# Patient Record
Sex: Female | Born: 1965 | Race: Black or African American | Hispanic: No | Smoking: Never smoker
Health system: Southern US, Community
[De-identification: ages and names within clinical notes are randomized; demographics above are authoritative.]

## PROBLEM LIST (undated history)

## (undated) DIAGNOSIS — Z803 Family history of malignant neoplasm of breast: Secondary | ICD-10-CM

## (undated) DIAGNOSIS — E559 Vitamin D deficiency, unspecified: Secondary | ICD-10-CM

## (undated) DIAGNOSIS — M0520 Rheumatoid vasculitis with rheumatoid arthritis of unspecified site: Secondary | ICD-10-CM

## (undated) DIAGNOSIS — Z1379 Encounter for other screening for genetic and chromosomal anomalies: Secondary | ICD-10-CM

## (undated) DIAGNOSIS — E669 Obesity, unspecified: Secondary | ICD-10-CM

## (undated) DIAGNOSIS — Z0389 Encounter for observation for other suspected diseases and conditions ruled out: Secondary | ICD-10-CM

## (undated) DIAGNOSIS — D649 Anemia, unspecified: Secondary | ICD-10-CM

## (undated) DIAGNOSIS — G43009 Migraine without aura, not intractable, without status migrainosus: Secondary | ICD-10-CM

## (undated) DIAGNOSIS — T7840XA Allergy, unspecified, initial encounter: Secondary | ICD-10-CM

## (undated) DIAGNOSIS — IMO0001 Reserved for inherently not codable concepts without codable children: Secondary | ICD-10-CM

## (undated) HISTORY — DX: Obesity, unspecified: E66.9

## (undated) HISTORY — DX: Reserved for inherently not codable concepts without codable children: IMO0001

## (undated) HISTORY — DX: Allergy, unspecified, initial encounter: T78.40XA

## (undated) HISTORY — DX: Encounter for observation for other suspected diseases and conditions ruled out: Z03.89

## (undated) HISTORY — DX: Encounter for other screening for genetic and chromosomal anomalies: Z13.79

## (undated) HISTORY — DX: Family history of malignant neoplasm of breast: Z80.3

## (undated) HISTORY — DX: Migraine without aura, not intractable, without status migrainosus: G43.009

## (undated) HISTORY — DX: Anemia, unspecified: D64.9

## (undated) HISTORY — DX: Vitamin D deficiency, unspecified: E55.9

## (undated) HISTORY — DX: Rheumatoid vasculitis with rheumatoid arthritis of unspecified site: M05.20

---

## 1992-09-27 HISTORY — PX: HEMORRHOID SURGERY: SHX153

## 1998-02-13 ENCOUNTER — Encounter: Admission: RE | Admit: 1998-02-13 | Discharge: 1998-02-13 | Payer: Self-pay | Admitting: Internal Medicine

## 1998-02-24 ENCOUNTER — Ambulatory Visit (HOSPITAL_COMMUNITY): Admission: RE | Admit: 1998-02-24 | Discharge: 1998-02-24 | Payer: Self-pay | Admitting: Obstetrics

## 1998-02-25 ENCOUNTER — Encounter: Admission: RE | Admit: 1998-02-25 | Discharge: 1998-02-25 | Payer: Self-pay | Admitting: Obstetrics & Gynecology

## 1998-03-10 ENCOUNTER — Ambulatory Visit (HOSPITAL_COMMUNITY): Admission: RE | Admit: 1998-03-10 | Discharge: 1998-03-10 | Payer: Self-pay | Admitting: Obstetrics & Gynecology

## 2001-09-24 ENCOUNTER — Emergency Department (HOSPITAL_COMMUNITY): Admission: EM | Admit: 2001-09-24 | Discharge: 2001-09-24 | Payer: Self-pay | Admitting: Emergency Medicine

## 2002-01-18 ENCOUNTER — Encounter: Payer: Self-pay | Admitting: Nephrology

## 2002-01-18 ENCOUNTER — Encounter: Admission: RE | Admit: 2002-01-18 | Discharge: 2002-01-18 | Payer: Self-pay | Admitting: Nephrology

## 2003-07-12 ENCOUNTER — Ambulatory Visit (HOSPITAL_COMMUNITY): Admission: RE | Admit: 2003-07-12 | Discharge: 2003-07-12 | Payer: Self-pay | Admitting: *Deleted

## 2003-07-12 ENCOUNTER — Encounter: Payer: Self-pay | Admitting: *Deleted

## 2003-07-26 ENCOUNTER — Ambulatory Visit (HOSPITAL_COMMUNITY): Admission: RE | Admit: 2003-07-26 | Discharge: 2003-07-26 | Payer: Self-pay | Admitting: *Deleted

## 2003-09-30 ENCOUNTER — Ambulatory Visit (HOSPITAL_COMMUNITY): Admission: RE | Admit: 2003-09-30 | Discharge: 2003-09-30 | Payer: Self-pay | Admitting: *Deleted

## 2003-12-10 ENCOUNTER — Encounter: Admission: RE | Admit: 2003-12-10 | Discharge: 2003-12-10 | Payer: Self-pay | Admitting: *Deleted

## 2003-12-12 ENCOUNTER — Inpatient Hospital Stay (HOSPITAL_COMMUNITY): Admission: AD | Admit: 2003-12-12 | Discharge: 2003-12-14 | Payer: Self-pay | Admitting: *Deleted

## 2004-07-09 ENCOUNTER — Ambulatory Visit (HOSPITAL_COMMUNITY): Admission: RE | Admit: 2004-07-09 | Discharge: 2004-07-09 | Payer: Self-pay | Admitting: *Deleted

## 2004-08-28 ENCOUNTER — Ambulatory Visit (HOSPITAL_COMMUNITY): Admission: RE | Admit: 2004-08-28 | Discharge: 2004-08-28 | Payer: Self-pay | Admitting: *Deleted

## 2004-11-13 ENCOUNTER — Ambulatory Visit (HOSPITAL_COMMUNITY): Admission: RE | Admit: 2004-11-13 | Discharge: 2004-11-13 | Payer: Self-pay | Admitting: *Deleted

## 2004-12-14 ENCOUNTER — Ambulatory Visit (HOSPITAL_COMMUNITY): Admission: RE | Admit: 2004-12-14 | Discharge: 2004-12-14 | Payer: Self-pay | Admitting: *Deleted

## 2005-01-04 ENCOUNTER — Inpatient Hospital Stay (HOSPITAL_COMMUNITY): Admission: AD | Admit: 2005-01-04 | Discharge: 2005-01-04 | Payer: Self-pay | Admitting: *Deleted

## 2005-01-07 ENCOUNTER — Inpatient Hospital Stay (HOSPITAL_COMMUNITY): Admission: RE | Admit: 2005-01-07 | Discharge: 2005-01-07 | Payer: Self-pay | Admitting: *Deleted

## 2005-01-07 ENCOUNTER — Ambulatory Visit: Payer: Self-pay | Admitting: Obstetrics & Gynecology

## 2005-01-11 ENCOUNTER — Ambulatory Visit: Payer: Self-pay | Admitting: Obstetrics and Gynecology

## 2005-01-11 ENCOUNTER — Inpatient Hospital Stay (HOSPITAL_COMMUNITY): Admission: AD | Admit: 2005-01-11 | Discharge: 2005-01-14 | Payer: Self-pay | Admitting: *Deleted

## 2006-09-28 ENCOUNTER — Encounter: Admission: RE | Admit: 2006-09-28 | Discharge: 2006-09-28 | Payer: Self-pay | Admitting: Obstetrics & Gynecology

## 2006-10-19 ENCOUNTER — Other Ambulatory Visit: Admission: RE | Admit: 2006-10-19 | Discharge: 2006-10-19 | Payer: Self-pay | Admitting: Obstetrics & Gynecology

## 2007-02-09 ENCOUNTER — Encounter: Admission: RE | Admit: 2007-02-09 | Discharge: 2007-02-09 | Payer: Self-pay | Admitting: Obstetrics & Gynecology

## 2008-12-16 ENCOUNTER — Encounter: Admission: RE | Admit: 2008-12-16 | Discharge: 2008-12-16 | Payer: Self-pay | Admitting: Internal Medicine

## 2009-05-06 ENCOUNTER — Encounter: Admission: RE | Admit: 2009-05-06 | Discharge: 2009-05-06 | Payer: Self-pay | Admitting: Obstetrics & Gynecology

## 2010-05-07 ENCOUNTER — Encounter: Admission: RE | Admit: 2010-05-07 | Discharge: 2010-05-07 | Payer: Self-pay | Admitting: Obstetrics & Gynecology

## 2010-05-13 ENCOUNTER — Encounter: Admission: RE | Admit: 2010-05-13 | Discharge: 2010-05-13 | Payer: Self-pay | Admitting: Obstetrics & Gynecology

## 2010-10-18 ENCOUNTER — Encounter: Payer: Self-pay | Admitting: Obstetrics & Gynecology

## 2011-08-31 ENCOUNTER — Other Ambulatory Visit: Payer: Self-pay | Admitting: Obstetrics & Gynecology

## 2011-08-31 DIAGNOSIS — Z1231 Encounter for screening mammogram for malignant neoplasm of breast: Secondary | ICD-10-CM

## 2011-09-27 ENCOUNTER — Ambulatory Visit
Admission: RE | Admit: 2011-09-27 | Discharge: 2011-09-27 | Disposition: A | Discharge status: Home / Self Care | Payer: BC Managed Care – PPO | Source: Ambulatory Visit | Attending: Obstetrics & Gynecology | Admitting: Obstetrics & Gynecology

## 2011-09-27 DIAGNOSIS — Z1231 Encounter for screening mammogram for malignant neoplasm of breast: Secondary | ICD-10-CM

## 2011-12-27 ENCOUNTER — Ambulatory Visit (INDEPENDENT_AMBULATORY_CARE_PROVIDER_SITE_OTHER): Payer: BC Managed Care – PPO | Admitting: Internal Medicine

## 2011-12-27 VITALS — BP 120/79 | HR 84 | Temp 98.1°F | Resp 16 | Ht 61.5 in | Wt 177.0 lb

## 2011-12-27 DIAGNOSIS — J019 Acute sinusitis, unspecified: Secondary | ICD-10-CM

## 2011-12-27 DIAGNOSIS — J301 Allergic rhinitis due to pollen: Secondary | ICD-10-CM

## 2011-12-27 DIAGNOSIS — J029 Acute pharyngitis, unspecified: Secondary | ICD-10-CM

## 2011-12-27 DIAGNOSIS — J329 Chronic sinusitis, unspecified: Secondary | ICD-10-CM

## 2011-12-27 MED ORDER — FLUTICASONE PROPIONATE 50 MCG/ACT NA SUSP: 2.0000 | Freq: Every day | NASAL | Status: DC

## 2011-12-27 MED ORDER — AMOXICILLIN 500 MG PO CAPS: 1000.0000 mg | ORAL_CAPSULE | Freq: Two times a day (BID) | ORAL | Status: AC

## 2011-12-27 MED ORDER — PREDNISONE 20 MG PO TABS: ORAL_TABLET | ORAL | Status: DC

## 2011-12-27 NOTE — Progress Notes (Signed)
  Subjective:    Patient ID: Jade Farrell, female    DOB: 1966-07-25, 46 y.o.   MRN: [id number]  HPI    Review of Systems     Objective:   Physical Exam        Results for orders placed in visit on 12/27/11  POCT RAPID STREP A (OFFICE)      Component Value Range   Rapid Strep A Screen Negative  Negative     Assessment & Plan:  Problem #1 recurrent sinusitis Problem #2 allergic rhinitis Problem #3 cough secondary

## 2012-09-22 ENCOUNTER — Other Ambulatory Visit: Payer: Self-pay | Admitting: Obstetrics & Gynecology

## 2012-09-22 DIAGNOSIS — Z1231 Encounter for screening mammogram for malignant neoplasm of breast: Secondary | ICD-10-CM

## 2012-09-28 ENCOUNTER — Other Ambulatory Visit: Payer: Self-pay | Admitting: Family Medicine

## 2012-09-28 DIAGNOSIS — R109 Unspecified abdominal pain: Secondary | ICD-10-CM

## 2012-09-29 ENCOUNTER — Ambulatory Visit
Admission: RE | Admit: 2012-09-29 | Discharge: 2012-09-29 | Disposition: A | Discharge status: Home / Self Care | Payer: BC Managed Care – PPO | Source: Ambulatory Visit | Attending: Family Medicine | Admitting: Family Medicine

## 2012-09-29 DIAGNOSIS — R109 Unspecified abdominal pain: Secondary | ICD-10-CM

## 2012-09-29 MED ORDER — IOHEXOL 300 MG/ML  SOLN
100.0000 mL | Freq: Once | INTRAMUSCULAR | Status: AC | PRN
  Administered 2012-09-29: 100 mL via INTRAVENOUS

## 2012-10-23 ENCOUNTER — Ambulatory Visit: Payer: BC Managed Care – PPO

## 2012-10-24 ENCOUNTER — Ambulatory Visit
Admission: RE | Admit: 2012-10-24 | Discharge: 2012-10-24 | Disposition: A | Discharge status: Home / Self Care | Payer: BC Managed Care – PPO | Source: Ambulatory Visit | Attending: Obstetrics & Gynecology | Admitting: Obstetrics & Gynecology

## 2012-10-24 DIAGNOSIS — Z1231 Encounter for screening mammogram for malignant neoplasm of breast: Secondary | ICD-10-CM

## 2012-12-21 ENCOUNTER — Other Ambulatory Visit: Payer: Self-pay | Admitting: Family Medicine

## 2012-12-21 DIAGNOSIS — R221 Localized swelling, mass and lump, neck: Secondary | ICD-10-CM

## 2013-01-08 ENCOUNTER — Ambulatory Visit
Admission: RE | Admit: 2013-01-08 | Discharge: 2013-01-08 | Disposition: A | Discharge status: Home / Self Care | Payer: BC Managed Care – PPO | Source: Ambulatory Visit | Attending: Family Medicine | Admitting: Family Medicine

## 2013-01-08 DIAGNOSIS — R221 Localized swelling, mass and lump, neck: Secondary | ICD-10-CM

## 2013-01-09 ENCOUNTER — Other Ambulatory Visit: Payer: Self-pay | Admitting: Family Medicine

## 2013-01-09 DIAGNOSIS — E041 Nontoxic single thyroid nodule: Secondary | ICD-10-CM

## 2013-01-12 ENCOUNTER — Ambulatory Visit (HOSPITAL_COMMUNITY)
Admission: RE | Admit: 2013-01-12 | Discharge: 2013-01-12 | Disposition: A | Discharge status: Home / Self Care | Payer: BC Managed Care – PPO | Source: Ambulatory Visit | Attending: Family Medicine | Admitting: Family Medicine

## 2013-01-12 DIAGNOSIS — E041 Nontoxic single thyroid nodule: Secondary | ICD-10-CM

## 2013-04-02 ENCOUNTER — Telehealth: Payer: Self-pay | Admitting: Gynecology

## 2013-04-02 DIAGNOSIS — N83209 Unspecified ovarian cyst, unspecified side: Secondary | ICD-10-CM

## 2013-04-02 NOTE — Telephone Encounter (Signed)
Left message on CB#VM of need to call office concerning her question of appointment,

## 2013-04-02 NOTE — Telephone Encounter (Signed)
Routed to triage 

## 2013-04-02 NOTE — Telephone Encounter (Signed)
Would like to know if she needs an appointment for follow up for her ultra sound.

## 2013-04-03 NOTE — Telephone Encounter (Signed)
Left message at call back # of need to return call for her question of ultra sound.

## 2013-04-04 ENCOUNTER — Telehealth: Payer: Self-pay | Admitting: Gynecology

## 2013-04-04 NOTE — Telephone Encounter (Signed)
Patient calling about scheduling follow up ultrasound. Jan 22014 PUS (see paper chart pre-EPIC) right ovarian cyst. Appt scheduled for 04-10-13 at 330.

## 2013-04-04 NOTE — Telephone Encounter (Signed)
LVM advising office copay due for pus.

## 2013-04-09 ENCOUNTER — Ambulatory Visit (INDEPENDENT_AMBULATORY_CARE_PROVIDER_SITE_OTHER): Payer: BC Managed Care – PPO | Admitting: Gynecology

## 2013-04-09 ENCOUNTER — Other Ambulatory Visit: Payer: Self-pay | Admitting: Gynecology

## 2013-04-09 ENCOUNTER — Ambulatory Visit (INDEPENDENT_AMBULATORY_CARE_PROVIDER_SITE_OTHER): Payer: BC Managed Care – PPO

## 2013-04-09 DIAGNOSIS — N83209 Unspecified ovarian cyst, unspecified side: Secondary | ICD-10-CM

## 2013-04-09 DIAGNOSIS — N949 Unspecified condition associated with female genital organs and menstrual cycle: Secondary | ICD-10-CM

## 2013-04-09 DIAGNOSIS — N839 Noninflammatory disorder of ovary, fallopian tube and broad ligament, unspecified: Secondary | ICD-10-CM

## 2013-04-09 DIAGNOSIS — N83201 Unspecified ovarian cyst, right side: Secondary | ICD-10-CM

## 2013-04-09 DIAGNOSIS — M545 Low back pain, unspecified: Secondary | ICD-10-CM

## 2013-04-09 DIAGNOSIS — Z975 Presence of (intrauterine) contraceptive device: Secondary | ICD-10-CM | POA: Insufficient documentation

## 2013-04-09 MED ORDER — NORETHIN-ETH ESTRAD-FE BIPHAS 1 MG-10 MCG / 10 MCG PO TABS: 1.0000 | ORAL_TABLET | Freq: Every day | ORAL | Status: DC

## 2013-04-09 NOTE — Patient Instructions (Addendum)
Recommend evaluation with chiropractor or orthopedics regarding low back pain

## 2013-04-09 NOTE — Progress Notes (Signed)
          Pt is here to follow up u/s for ovarian cyst on left, pt is contracepting with an IUD-Mirena and has no cycle, she reports having left sided pain.  The u/s from today and previous were compared, the leftt sided cyst that was noted on the prior scan has resolved however she now has a similar cyst on the right, it appears to have 2 follicles, both simple, echofree and neg color flow doppler. We discussed that these appear to be benign ovarian cysts that might be related to ovulation.  We suggested a trial of oral contraceptives to suppress her ovaries to see if indeed the cyst is related to her pelvic pain, we discussed starting oc as she does not have a menses and that she might have some break-thru bleeding initially, but by the second pack we will be better able to assess.   Pt has no contraindications to oc use, she has rare migraines but no aura.  She reports she has the IUD for heavy menses and is assured that proper oc use will treat that as well.  If she does well on the ocp, we will consider removal of the IUD, she is agreeable.@ samples of LoLoestrin were given and rx was sent in We would like to see her back in 6m. We also suggest she be evaluated by either ortho or chiropractor for her low back pain if it persists.  Length of time 72m, >50% face to face discussing pelvic pain, ovarian cysts and menorrhagia

## 2013-07-04 ENCOUNTER — Encounter: Payer: Self-pay | Admitting: Gynecology

## 2013-07-05 ENCOUNTER — Telehealth: Payer: Self-pay | Admitting: Gynecology

## 2013-07-05 NOTE — Telephone Encounter (Signed)
LMTCB to reschedule PUS.  °

## 2013-07-10 ENCOUNTER — Other Ambulatory Visit: Payer: BC Managed Care – PPO | Admitting: Gynecology

## 2013-07-10 ENCOUNTER — Other Ambulatory Visit: Payer: BC Managed Care – PPO

## 2013-07-13 ENCOUNTER — Telehealth: Payer: Self-pay | Admitting: Gynecology

## 2013-07-13 NOTE — Telephone Encounter (Signed)
LMTCB to reschedule PUS.

## 2013-08-07 ENCOUNTER — Ambulatory Visit (INDEPENDENT_AMBULATORY_CARE_PROVIDER_SITE_OTHER): Payer: BC Managed Care – PPO

## 2013-08-07 ENCOUNTER — Ambulatory Visit (INDEPENDENT_AMBULATORY_CARE_PROVIDER_SITE_OTHER): Payer: BC Managed Care – PPO | Admitting: Gynecology

## 2013-08-07 VITALS — BP 118/80 | Resp 12 | Ht 61.5 in | Wt 181.0 lb

## 2013-08-07 DIAGNOSIS — N83209 Unspecified ovarian cyst, unspecified side: Secondary | ICD-10-CM

## 2013-08-07 DIAGNOSIS — N83299 Other ovarian cyst, unspecified side: Secondary | ICD-10-CM | POA: Insufficient documentation

## 2013-08-07 DIAGNOSIS — M545 Low back pain, unspecified: Secondary | ICD-10-CM | POA: Insufficient documentation

## 2013-08-07 NOTE — Progress Notes (Signed)
      Pt here to evaluate recurrent ovarian cysts.  Pt had had a right sided cyst as an incidental finding for evaluation of lower back pain in Jan 2014, f/u u/'s have shown a left and then a right cyst and now today a left cyst.  The images were all reviewed with the pt, they appear to be different cysts, she does not have a cycle due to the Mirena IUD.  Pt is without symptoms, we had started her on ocp to suppress the ovaries but she was forgetful with them and only used for 6w.  We reviewed the MOA of ocp with recurrent cyst, she feels that since she is asymptomatic that she would prefer to do nothing at this time. We reviewed the signs and symptoms of ovarian cysts and cyst rupture and suggest if she develops symptoms she should be seen and she is agreeable. We will see her for her annual in Feb 2015 and reassess how she is doing.

## 2013-10-02 ENCOUNTER — Other Ambulatory Visit: Payer: Self-pay

## 2013-10-02 DIAGNOSIS — Z1231 Encounter for screening mammogram for malignant neoplasm of breast: Secondary | ICD-10-CM

## 2013-10-18 ENCOUNTER — Ambulatory Visit: Payer: BC Managed Care – PPO | Admitting: Gynecology

## 2013-10-19 ENCOUNTER — Ambulatory Visit: Payer: BC Managed Care – PPO | Admitting: Gynecology

## 2013-10-24 ENCOUNTER — Encounter: Payer: Self-pay | Admitting: Gynecology

## 2013-10-24 DIAGNOSIS — G43009 Migraine without aura, not intractable, without status migrainosus: Secondary | ICD-10-CM | POA: Insufficient documentation

## 2013-10-26 ENCOUNTER — Ambulatory Visit: Payer: BC Managed Care – PPO

## 2013-10-29 ENCOUNTER — Ambulatory Visit: Payer: BC Managed Care – PPO | Admitting: Gynecology

## 2013-10-29 ENCOUNTER — Telehealth: Payer: Self-pay | Admitting: Gynecology

## 2013-10-29 NOTE — Telephone Encounter (Signed)
Patient called and cancelled her appointment for AEX today with Dr. Farrel GobbleLathrop due to she thinks she may have the flu. She will call back to reschedule and is in recall.

## 2013-11-15 ENCOUNTER — Ambulatory Visit (INDEPENDENT_AMBULATORY_CARE_PROVIDER_SITE_OTHER): Payer: BC Managed Care – PPO | Admitting: Cardiology

## 2013-11-15 ENCOUNTER — Encounter: Payer: Self-pay | Admitting: Cardiology

## 2013-11-15 VITALS — BP 120/86 | HR 86 | Ht 61.0 in | Wt 182.0 lb

## 2013-11-15 DIAGNOSIS — R079 Chest pain, unspecified: Secondary | ICD-10-CM

## 2013-11-15 NOTE — Progress Notes (Signed)
Patient ID: Jade Farrell, female   DOB: [birth year], 48 y.o.   MRN: [id number]     Patient Name: Jade Farrell Date of Encounter: 11/15/2013  Primary Care Provider:  No PCP Per Patient Primary Cardiologist:  Tobias Alexander, H  Problem List   Past Medical History  Diagnosis Date  . Anemia   . Migraine without aura   . Allergy   . Arthritis    Past Surgical History  Procedure Laterality Date  . Hemorrhoid surgery  1994    Removed    Allergies  No Known Allergies  HPI  A 48 year old female with no significant PMH who is coming with complains of chest pain. The pain at the right upper chest radiating to the right arm. They can happen at rest and on exertion, sharp and lasting few minutes. She has positive FH of CD In her father (MI in hs 43) and paternal uncle who had PCI/stenting. 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 She otherwise denies SOB, Palpitations, orthopnea or PND. The patient has 5 children.   Home Medications  Prior to Admission medications   Medication Sig Start Date End Date Taking? Authorizing Provider  Ibuprofen (ADVIL PO) Take by mouth as needed.   Yes Historical Provider, MD  loratadine (CLARITIN) 10 MG tablet Take 10 mg by mouth daily.   Yes Historical Provider, MD  meloxicam (MOBIC) 15 MG tablet Take 15 mg by mouth daily.   Yes Historical Provider, MD  Multiple Vitamins-Minerals (MULTIVITAMIN PO) Take by mouth.   Yes Historical Provider, MD  Naproxen Sodium (ALEVE PO) Take by mouth as needed.   Yes Historical Provider, MD  Vitamin D, Ergocalciferol, (DRISDOL) 50000 UNITS CAPS Take 50,000 Units by mouth.   Yes Historical Provider, MD  amoxicillin-clavulanate (AUGMENTIN) 875-125 MG per tablet Take 1 tablet by mouth 2 (two) times daily.    Historical Provider, MD  cyclobenzaprine (FLEXERIL) 10 MG tablet Take 10 mg by mouth 3 (three) times daily as needed for muscle spasms.    Historical Provider, MD  fluticasone (FLONASE) 50 MCG/ACT nasal  spray Place 2 sprays into the nose daily. 12/27/11 12/26/12  Tonye Pearson, MD  mometasone (NASONEX) 50 MCG/ACT nasal spray Place 2 sprays into the nose daily.    Historical Provider, MD  Norethindrone-Ethinyl Estradiol-Fe Biphas (LO LOESTRIN FE) 1 MG-10 MCG / 10 MCG tablet Take 1 tablet by mouth daily. 04/09/13   Bennye Alm, MD  Norethindrone-Ethinyl Estradiol-Fe Biphas (LO LOESTRIN FE) 1 MG-10 MCG / 10 MCG tablet Take 1 tablet by mouth daily. 04/09/13   Bennye Alm, MD  predniSONE (DELTASONE) 20 MG tablet 3/3/2/2/1/1Single daily dose for 6 days 12/27/11   Tonye Pearson, MD    Family History  Family History  Problem Relation Age of Onset  . Diabetes Mother   . Hypertension Father   . Heart disease Father   . Breast cancer Sister 47  . Hypertension Sister   . Thyroid disease Sister   . Hypertension Brother   . Heart disease Paternal Uncle     Social History  History   Social History  . Marital Status: Married    Spouse Name: N/A    Number of Children: N/A  . Years of Education: N/A   Occupational History  . Not on file.   Social History Main Topics  . Smoking status: Never Smoker   . Smokeless tobacco: Not on file  . Alcohol Use: Not  on file  . Drug Use: Not on file  . Sexual Activity: Not on file   Other Topics Concern  . Not on file   Social History Narrative  . No narrative on file     Review of Systems, as per HPI, otherwise negative General:  No chills, fever, night sweats or weight changes.  Cardiovascular:  No chest pain, dyspnea on exertion, edema, orthopnea, palpitations, paroxysmal nocturnal dyspnea. Dermatological: No rash, lesions/masses Respiratory: No cough, dyspnea Urologic: No hematuria, dysuria Abdominal:   No nausea, vomiting, diarrhea, bright red blood per rectum, melena, or hematemesis Neurologic:  No visual changes, wkns, changes in mental status. All other systems reviewed and are otherwise negative except as noted  above.  Physical Exam  Blood pressure 120/86, pulse 86, height 5\' 1"  (1.549 m), weight 182 lb (82.555 kg).  General: Pleasant, NAD Psych: Normal affect. Neuro: Alert and oriented X 3. Moves all extremities spontaneously. HEENT: Normal  Neck: Supple without bruits or JVD. Lungs:  Resp regular and unlabored, CTA. Heart: RRR no s3, s4, or murmurs. Abdomen: Soft, non-tender, non-distended, BS + x 4.  Extremities: No clubbing, cyanosis or edema. DP/PT/Radials 2+ and equal bilaterally.  Labs: None  Accessory Clinical Findings  Echocardiogram - none  ECG - normal sinus rhythm with sinus arrhythmia, right atrium enlargement, borderline EKG   Assessment & Plan    A pleasant 48 year old female with atypical chest pain but risk factors that include FH of CAD, obesity. We will order an exercise nuclear stress test and follow.   Follow up in 1 month.  Tobias AlexanderNELSON, Yichen Gilardi, Rexene EdisonH, MD, [facility] HospitalFACC 11/15/2013, 10:21 AM

## 2013-11-15 NOTE — Patient Instructions (Signed)
**Note De-Identified Jade Farrell Obfuscation** Your physician has requested that you have en exercise stress myoview. For further information please visit [email or web address]. Please follow instruction sheet, as given.  Your physician recommends that you schedule a follow-up appointment in: after stress test

## 2013-11-21 ENCOUNTER — Encounter (HOSPITAL_COMMUNITY): Payer: BC Managed Care – PPO

## 2013-11-29 ENCOUNTER — Other Ambulatory Visit: Payer: Self-pay

## 2013-11-29 DIAGNOSIS — R079 Chest pain, unspecified: Secondary | ICD-10-CM

## 2013-11-30 ENCOUNTER — Ambulatory Visit (INDEPENDENT_AMBULATORY_CARE_PROVIDER_SITE_OTHER): Payer: BC Managed Care – PPO | Admitting: Cardiology

## 2013-11-30 DIAGNOSIS — R079 Chest pain, unspecified: Secondary | ICD-10-CM

## 2013-11-30 NOTE — Progress Notes (Signed)
Patient ID: Jade Farrell, female   DOB: 02-01-1966, 48 y.o.   MRN: [id number]  The appointment had to be rescheduled as the tests were not performed yet.

## 2013-12-19 ENCOUNTER — Ambulatory Visit (HOSPITAL_COMMUNITY): Payer: BC Managed Care – PPO | Attending: Cardiology | Admitting: Radiology

## 2013-12-19 DIAGNOSIS — R079 Chest pain, unspecified: Secondary | ICD-10-CM | POA: Insufficient documentation

## 2013-12-19 DIAGNOSIS — R072 Precordial pain: Secondary | ICD-10-CM

## 2013-12-19 NOTE — Progress Notes (Signed)
Echocardiogram performed.  

## 2013-12-20 ENCOUNTER — Other Ambulatory Visit: Payer: Self-pay

## 2013-12-20 ENCOUNTER — Telehealth (HOSPITAL_COMMUNITY): Payer: Self-pay

## 2013-12-20 ENCOUNTER — Ambulatory Visit (HOSPITAL_COMMUNITY)
Admission: RE | Admit: 2013-12-20 | Discharge: 2013-12-20 | Disposition: A | Discharge status: Home / Self Care | Payer: BC Managed Care – PPO | Source: Ambulatory Visit | Attending: Cardiology | Admitting: Cardiology

## 2013-12-20 DIAGNOSIS — R079 Chest pain, unspecified: Secondary | ICD-10-CM | POA: Insufficient documentation

## 2013-12-20 DIAGNOSIS — R9439 Abnormal result of other cardiovascular function study: Secondary | ICD-10-CM

## 2013-12-21 ENCOUNTER — Telehealth: Payer: Self-pay | Admitting: Cardiology

## 2013-12-21 ENCOUNTER — Other Ambulatory Visit: Payer: BC Managed Care – PPO

## 2013-12-21 ENCOUNTER — Other Ambulatory Visit: Payer: Self-pay

## 2013-12-21 DIAGNOSIS — R079 Chest pain, unspecified: Secondary | ICD-10-CM

## 2013-12-21 NOTE — Telephone Encounter (Signed)
Patient called received message from Dr.Nelson stress test abnormal.Dr.Nelson advised needs cardiac cath as soon as possible.Left cardiac [facility] [place] [facility] [place] [facility]/13 at 11:30 am to pick up instructions and have pre cath lab,cxr.

## 2013-12-21 NOTE — Telephone Encounter (Signed)
New message ° ° ° ° ° °Pt is returning a nurses call °

## 2013-12-21 NOTE — Telephone Encounter (Signed)
Returned call to patient she stated she wanted to reschedule cardiac cath to Tuesday 12/25/13.Cardiac cath [facility]Patient will come to office Monday to have pre cath lab work,cxr and pick up cath instructions.

## 2013-12-21 NOTE — Addendum Note (Signed)
Addended by: Lars MassonNELSON, Emari Hreha H on: 12/21/2013 03:06 PM   Modules accepted: Orders

## 2013-12-24 ENCOUNTER — Ambulatory Visit
Admission: RE | Admit: 2013-12-24 | Discharge: 2013-12-24 | Disposition: A | Discharge status: Home / Self Care | Payer: BC Managed Care – PPO | Source: Ambulatory Visit | Attending: Cardiology | Admitting: Cardiology

## 2013-12-24 ENCOUNTER — Ambulatory Visit (INDEPENDENT_AMBULATORY_CARE_PROVIDER_SITE_OTHER): Payer: BC Managed Care – PPO | Admitting: *Deleted

## 2013-12-24 ENCOUNTER — Telehealth: Payer: Self-pay | Admitting: Cardiology

## 2013-12-24 DIAGNOSIS — R9439 Abnormal result of other cardiovascular function study: Secondary | ICD-10-CM

## 2013-12-24 DIAGNOSIS — R079 Chest pain, unspecified: Secondary | ICD-10-CM

## 2013-12-24 LAB — BASIC METABOLIC PANEL
BUN: 16 mg/dL (ref 6–23)
CO2: 30 mEq/L (ref 19–32)
Calcium: 9.5 mg/dL (ref 8.4–10.5)
Chloride: 99 mEq/L (ref 96–112)
Creatinine, Ser: 0.6 mg/dL (ref 0.4–1.2)
GFR: 129.94 mL/min (ref >60.00)
Glucose, Bld: 83 mg/dL (ref 70–99)
Potassium: 3.8 mEq/L (ref 3.5–5.1)
Sodium: 136 mEq/L (ref 135–145)

## 2013-12-24 LAB — PROTIME-INR
INR: 1.1 ratio — ABNORMAL HIGH (ref 0.8–1.0)
Prothrombin Time: 11.3 s (ref 10.2–12.4)

## 2013-12-24 LAB — APTT: aPTT: 33.9 s — ABNORMAL HIGH (ref 21.7–28.8)

## 2013-12-24 NOTE — Telephone Encounter (Signed)
New message     Pt has a cath scheduled for tomorrow---she want to talk to the nurse

## 2013-12-24 NOTE — Telephone Encounter (Signed)
New Message  Pt called to reschedule Cath

## 2013-12-24 NOTE — Telephone Encounter (Signed)
**Note De-Identified Haralambos Yeatts Obfuscation** The pt states that she could not make arrangements to have her cath done tomorrow so, per her request, the cath has been rescheduled to 12/28/13 at 9 am with Dr SwazilandJordan. The pt is in agreement with new date and time of cath and I went over cath instructions with the pt over the phone, she verbalized understanding and because this is the third time this cath has been ordered she has a copy of cath instructions. She had labs and CXR done today. Dr Delton SeeNelson is aware of change in date of cath.

## 2013-12-24 NOTE — Telephone Encounter (Signed)
The pt wants to cancel her cath that is scheduled for tomorrow and reschedule on Friday

## 2013-12-25 LAB — CBC WITH DIFFERENTIAL/PLATELET
Basophils Absolute: 0.1 10*3/uL (ref 0.0–0.1)
Basophils Relative: 0.9 % (ref 0.0–3.0)
Eosinophils Absolute: 0.1 10*3/uL (ref 0.0–0.7)
Eosinophils Relative: 1.9 % (ref 0.0–5.0)
HCT: 39.2 % (ref 36.0–46.0)
Hemoglobin: 12.9 g/dL (ref 12.0–15.0)
Lymphocytes Relative: 38 % (ref 12.0–46.0)
Lymphs Abs: 2.6 10*3/uL (ref 0.7–4.0)
MCHC: 33 g/dL (ref 30.0–36.0)
MCV: 80.1 fl (ref 78.0–100.0)
Monocytes Absolute: 0.3 10*3/uL (ref 0.1–1.0)
Monocytes Relative: 4.9 % (ref 3.0–12.0)
Neutro Abs: 3.7 10*3/uL (ref 1.4–7.7)
Neutrophils Relative %: 54.3 % (ref 43.0–77.0)
Platelets: 347 10*3/uL (ref 150.0–400.0)
RBC: 4.9 Mil/uL (ref 3.87–5.11)
RDW: 13.6 % (ref 11.5–14.6)
WBC: 6.9 10*3/uL (ref 4.5–10.5)

## 2013-12-26 ENCOUNTER — Encounter (HOSPITAL_COMMUNITY): Payer: Self-pay | Admitting: Pharmacy Technician

## 2013-12-27 ENCOUNTER — Other Ambulatory Visit: Payer: Self-pay | Admitting: Family Medicine

## 2013-12-27 DIAGNOSIS — E049 Nontoxic goiter, unspecified: Secondary | ICD-10-CM

## 2013-12-28 ENCOUNTER — Ambulatory Visit (HOSPITAL_COMMUNITY)
Admission: RE | Admit: 2013-12-28 | Discharge: 2013-12-28 | Disposition: A | Discharge status: Home / Self Care | Payer: BC Managed Care – PPO | Source: Ambulatory Visit | Attending: Cardiovascular Disease | Admitting: Cardiovascular Disease

## 2013-12-28 ENCOUNTER — Encounter (HOSPITAL_COMMUNITY)
Admission: RE | Disposition: A | Discharge status: Home / Self Care | Payer: Self-pay | Source: Ambulatory Visit | Attending: Cardiovascular Disease

## 2013-12-28 ENCOUNTER — Encounter (HOSPITAL_COMMUNITY): Payer: Self-pay | Admitting: Physician Assistant

## 2013-12-28 DIAGNOSIS — R079 Chest pain, unspecified: Secondary | ICD-10-CM

## 2013-12-28 DIAGNOSIS — Z7982 Long term (current) use of aspirin: Secondary | ICD-10-CM | POA: Insufficient documentation

## 2013-12-28 DIAGNOSIS — E669 Obesity, unspecified: Secondary | ICD-10-CM | POA: Insufficient documentation

## 2013-12-28 DIAGNOSIS — R9430 Abnormal result of cardiovascular function study, unspecified: Secondary | ICD-10-CM

## 2013-12-28 DIAGNOSIS — R9439 Abnormal result of other cardiovascular function study: Secondary | ICD-10-CM | POA: Diagnosis present

## 2013-12-28 HISTORY — PX: LEFT HEART CATHETERIZATION WITH CORONARY ANGIOGRAM: SHX5451

## 2013-12-28 LAB — PREGNANCY, URINE: PREG TEST UR: NEGATIVE

## 2013-12-28 SURGERY — LEFT HEART CATHETERIZATION WITH CORONARY ANGIOGRAM
Anesthesia: LOCAL

## 2013-12-28 MED ORDER — HEPARIN SODIUM (PORCINE) 1000 UNIT/ML IJ SOLN
INTRAMUSCULAR | Status: AC
  Filled 2013-12-28: qty 1

## 2013-12-28 MED ORDER — SODIUM CHLORIDE 0.9 % IV SOLN
1.0000 mL/kg/h | INTRAVENOUS | Status: DC
Start: 2013-12-28 — End: 2013-12-28

## 2013-12-28 MED ORDER — ASPIRIN 81 MG PO CHEW: 81.0000 mg | CHEWABLE_TABLET | ORAL | Status: DC

## 2013-12-28 MED ORDER — SODIUM CHLORIDE 0.9 % IJ SOLN
3.0000 mL | Freq: Two times a day (BID) | INTRAMUSCULAR | Status: DC
  Administered 2013-12-28: 3 mL via INTRAVENOUS

## 2013-12-28 MED ORDER — LIDOCAINE HCL (PF) 1 % IJ SOLN
INTRAMUSCULAR | Status: AC
  Filled 2013-12-28: qty 30

## 2013-12-28 MED ORDER — SODIUM CHLORIDE 0.9 % IV SOLN: 250.0000 mL | INTRAVENOUS | Status: DC | PRN

## 2013-12-28 MED ORDER — VERAPAMIL HCL 2.5 MG/ML IV SOLN
INTRAVENOUS | Status: AC
  Filled 2013-12-28: qty 2

## 2013-12-28 MED ORDER — SODIUM CHLORIDE 0.9 % IJ SOLN: 3.0000 mL | INTRAMUSCULAR | Status: DC | PRN

## 2013-12-28 MED ORDER — SODIUM CHLORIDE 0.9 % IV SOLN
INTRAVENOUS | Status: DC
  Administered 2013-12-28: 08:00:00 via INTRAVENOUS

## 2013-12-28 MED ORDER — NITROGLYCERIN 0.2 MG/ML ON CALL CATH LAB
INTRAVENOUS | Status: AC
  Filled 2013-12-28: qty 1

## 2013-12-28 MED ORDER — FENTANYL CITRATE 0.05 MG/ML IJ SOLN
INTRAMUSCULAR | Status: AC
  Filled 2013-12-28: qty 2

## 2013-12-28 MED ORDER — MIDAZOLAM HCL 2 MG/2ML IJ SOLN
INTRAMUSCULAR | Status: AC
  Filled 2013-12-28: qty 2

## 2013-12-28 MED ORDER — HEPARIN (PORCINE) IN NACL 2-0.9 UNIT/ML-% IJ SOLN
INTRAMUSCULAR | Status: AC
  Filled 2013-12-28: qty 1000

## 2013-12-28 NOTE — Discharge Instructions (Signed)

## 2013-12-28 NOTE — CV Procedure (Signed)
    Cardiac Catheterization Procedure Note  Name: Jade Farrell MRN: [id number] DOB: 30-May-1966  Procedure: Left Heart Cath, Selective Coronary Angiography, LV angiography  Indication: 48 yo female with recent chest pain. Exercise stress test was abnormal.   Procedural Details: The right wrist was prepped, draped, and anesthetized with 1% lidocaine. Using the modified Seldinger technique, a 5 French sheath was introduced into the right radial artery. 3 mg of verapamil was administered through the sheath, weight-based unfractionated heparin was administered intravenously. Standard Judkins catheters were used for selective coronary angiography and left ventriculography. Catheter exchanges were performed over an exchange length guidewire. There were no immediate procedural complications. A TR band was used for radial hemostasis at the completion of the procedure.  The patient was transferred to the post catheterization recovery area for further monitoring.  Procedural Findings: Hemodynamics: AO 151/88 mean 116 mm Hg LV 148/19 mm Hg  Coronary angiography: Coronary dominance: right  Left mainstem: Normal  Left anterior descending (LAD): Normal  Left circumflex (LCx): Normal  Right coronary artery (RCA): Normal  Left ventriculography: Left ventricular systolic function is normal, LVEF is estimated at 55-65%, there is no significant mitral regurgitation   Final Conclusions:   1. Normal coronary anatomy 2. Normal LV function  Recommendations: Medical management.  Theron Aristaeter [facility] HospitalJordanMD,FACC 12/28/2013, 11:45 AM

## 2013-12-28 NOTE — H&P (Signed)
History and Physical   Patient ID: Jade Farrell MRN: [id number], DOB/AGE: 48-Jun-1967 48 y.o. Date of Encounter: 12/28/2013  Primary Physician: Cala Bradford, MD Primary Cardiologist: Dr. Delton See  Chief Complaint:  Chest pain  HPI: Jade Farrell is a 48 y.o. female with no history of CAD whose risk factors are FH and obesity. She saw Dr. Delton See for right-sided chest pain. She had an exercise stress test. During the Bruce protocol, in stage 2, she developed throat tightness and her ECG became abnormal. The results are below. She was scheduled for cath and is here today for the procedure.   She has not had the throat pain she had during the stress test since that day. She had these symptoms prior to that day but was told by her allergist, it was probably an allergic reaction. She has had upper back pain and the right chest pain. She is concerned that she will not be able to tell if it's angina should it happen again.  Past Medical History  Diagnosis Date  . Anemia   . Migraine without aura   . Allergy   . Arthritis     Surgical History:  Past Surgical History  Procedure Laterality Date  . Hemorrhoid surgery  1994    Removed     I have reviewed the patient's current medications. Prior to Admission medications   Medication Sig Start Date End Date Taking? Authorizing Provider  aspirin 325 MG tablet Take 325 mg by mouth daily.   Yes Historical Provider, MD  aspirin EC 325 MG tablet Take 325 mg by mouth daily.   Yes Historical Provider, MD  Cetirizine HCl (ZYRTEC ALLERGY PO) Take 1 tablet by mouth 2 (two) times daily.   Yes Historical Provider, MD  Cholecalciferol (VITAMIN D-3) 5000 UNITS TABS Take 5,000 Units by mouth daily.   Yes Historical Provider, MD  Ibuprofen (ADVIL PO) Take 2 tablets by mouth daily as needed (pain).    Yes Historical Provider, MD  meloxicam (MOBIC) 15 MG tablet Take 15 mg by mouth daily as needed for pain.    Yes Historical Provider, MD  montelukast  (SINGULAIR) 5 MG chewable tablet Chew 5 mg by mouth at bedtime.   Yes Historical Provider, MD  Multiple Vitamins-Minerals (MULTIVITAMIN PO) Take 1 tablet by mouth daily.    Yes Historical Provider, MD  Naproxen Sodium (ALEVE PO) Take 1 tablet by mouth daily as needed (pain).    Yes Historical Provider, MD  ranitidine (ZANTAC) 150 MG tablet Take 150 mg by mouth 2 (two) times daily.   Yes Historical Provider, MD   Scheduled Meds: . aspirin  81 mg Oral Pre-Cath  . sodium chloride  3 mL Intravenous Q12H   Continuous Infusions: . [START ON 12/29/2013] sodium chloride 50 mL/hr at 12/28/13 0735   PRN Meds:.sodium chloride, sodium chloride  Allergies: No Known Allergies  History   Social History  . Marital Status: Married    Spouse Name: N/A    Number of Children: N/A  . Years of Education: N/A   Occupational History  . TEACHER    Social History Main Topics  . Smoking status: Never Smoker   . Smokeless tobacco: Never Used  . Alcohol Use: No  . Drug Use: No  . Sexual Activity: Not on file   Other Topics Concern  . Not on file   Social History Narrative   Lives with husband.    Family History  Problem Relation Age of Onset  .  Diabetes Mother   . Hypertension Father   . Heart disease Father   . Breast cancer Sister 71  . Hypertension Sister   . Thyroid disease Sister   . Hypertension Brother   . Heart disease Paternal Uncle    Family Status  Relation Status Death Age  . Mother Alive   . Father Deceased   . Paternal Uncle Deceased   . Maternal Grandmother Deceased   . Maternal Grandfather Deceased   . Paternal Grandmother Deceased   . Paternal Grandfather Deceased     Review of Systems:   Full 14-point review of systems otherwise negative except as noted above.  Physical Exam: Blood pressure 148/67, pulse 74, temperature 97.9 F (36.6 C), temperature source Oral, resp. rate 18, height 5\' 2"  (1.575 m), weight 175 lb (79.379 kg), SpO2 99.00%. General: Well  developed, well nourished,female in no acute distress. Head: Normocephalic, atraumatic, sclera non-icteric, no xanthomas, nares are without discharge. Dentition: good Neck: No carotid bruits. JVD not elevated. No thyromegally Lungs: Good expansion bilaterally. without wheezes or rhonchi.  Heart: Regular rate and rhythm with S1 S2.  No S3 or S4.  No murmur, no rubs, or gallops appreciated. Abdomen: Soft, non-tender, non-distended with normoactive bowel sounds. No hepatomegaly. No rebound/guarding. No obvious abdominal masses. Msk:  Strength and tone appear normal for age. No joint deformities or effusions, no spine or costo-vertebral angle tenderness. Extremities: No clubbing or cyanosis. No edema.  Distal pedal pulses are 2+ in 4 extrem Neuro: Alert and oriented X 3. Moves all extremities spontaneously. No focal deficits noted. Psych:  Responds to questions appropriately with a normal affect. Skin: No rashes or lesions noted  Labs:  Lab Results  Component Value Date   WBC 6.9 12/24/2013   HGB 12.9 12/24/2013   HCT 39.2 12/24/2013   MCV 80.1 12/24/2013   PLT 347.0 12/24/2013     Recent Labs Lab 12/24/13 1058  NA 136  K 3.8  CL 99  CO2 30  BUN 16  CREATININE 0.6  CALCIUM 9.5  GLUCOSE 83   ECHO: 12/19/2013 Study Conclusions - Left ventricle: The cavity size was normal. Wall thickness was normal. Systolic function was normal. The estimated ejection fraction was in the range of 60% to 65%. Wall motion was normal; there were no regional wall motion abnormalities. Left ventricular diastolic function parameters were normal. - Aortic valve: There was no stenosis. - Mitral valve: No regurgitation. - Right ventricle: The cavity size was normal. Systolic function was normal. - Tricuspid valve: Peak RV-RA gradient:87mm Hg (S). - Pulmonary arteries: PA peak pressure: 27mm Hg (S). - Inferior vena cava: The vessel was normal in size; the respirophasic diameter changes were in the normal  range (= 50%); findings are consistent with normal central venous pressure. Impressions: - Normal study.  Exercise stress test: 12/20/2013 IMPRESSION: 1. Reduced overal exercise tolerance 2. Limiting Angina (throat pain with dyspnea) 3. Ischemic ST Depressions in Lateral & Inferior Leads ** Duke TM Score: - 14.5; HIGH RISK Angiography Usually Indicated. Clinical correlation warranted.  ECG: pending  ASSESSMENT AND PLAN:  Principal Problem:   Abnormal stress electrocardiogram test using treadmill - Pt here today for cath. Further evaluation and treatment depending on the results.  BP slightly elevated but she is anxious about the procedure, follow for now. If + for CAD, consider BB.  No lipid profile in system, MD advise on doing here or as OP.   Melida Quitter, PA-C 12/28/2013 9:05 AM Beeper [phone]  Patient seen and examined and history reviewed. Agree with above findings and plan. Recent chest pain ? Related to allergic reaction. Abnormal stress test. Will procceed with cardiac cath. The procedure and risks were reviewed including but not limited to death, myocardial infarction, stroke, arrythmias, bleeding, transfusion, emergency surgery, dye allergy, or renal dysfunction. The patient voices understanding and is agreeable to proceed.   Theron Aristaeter [facility] FerryJordanMD,FACC 12/28/2013 11:20 AM

## 2013-12-28 NOTE — Interval H&P Note (Signed)
History and Physical Interval Note:  12/28/2013 11:22 AM  Jade Farrell  has presented today for surgery, with the diagnosis of abnormal stress test  The various methods of treatment have been discussed with the patient and family. After consideration of risks, benefits and other options for treatment, the patient has consented to  Procedure(s): LEFT HEART CATHETERIZATION WITH CORONARY ANGIOGRAM (N/A) as a surgical intervention .  The patient's history has been reviewed, patient examined, no change in status, stable for surgery.  I have reviewed the patient's chart and labs.  Questions were answered to the patient's satisfaction.    Cath Lab Visit (complete for each Cath Lab visit)  Clinical Evaluation Leading to the Procedure:   ACS: no  Non-ACS:    Anginal Classification: CCS II  Anti-ischemic medical therapy: No Therapy  Non-Invasive Test Results: High-risk stress test findings: cardiac mortality >3%/year  Prior CABG: No previous CABG       Theron AristaPeter [facility] HospitalJordanMD,FACC 12/28/2013 11:22 AM

## 2014-01-02 ENCOUNTER — Ambulatory Visit
Admission: RE | Admit: 2014-01-02 | Discharge: 2014-01-02 | Disposition: A | Discharge status: Home / Self Care | Payer: BC Managed Care – PPO | Source: Ambulatory Visit

## 2014-01-02 DIAGNOSIS — Z1231 Encounter for screening mammogram for malignant neoplasm of breast: Secondary | ICD-10-CM

## 2014-01-04 ENCOUNTER — Encounter: Payer: Self-pay | Admitting: Cardiology

## 2014-01-04 ENCOUNTER — Ambulatory Visit (INDEPENDENT_AMBULATORY_CARE_PROVIDER_SITE_OTHER): Payer: BC Managed Care – PPO | Admitting: Cardiology

## 2014-01-04 DIAGNOSIS — E669 Obesity, unspecified: Secondary | ICD-10-CM | POA: Insufficient documentation

## 2014-01-04 NOTE — Patient Instructions (Signed)
Your physician recommends that you continue on your current medications as directed. Please refer to the Current Medication list given to you today.  Your physician wants you to follow-up in: 1 year with Dr. Nelson. You will receive a reminder letter in the mail two months in advance. If you don't receive a letter, please call our office to schedule the follow-up appointment.  

## 2014-01-04 NOTE — Progress Notes (Signed)
Patient ID: Jade Farrell, female   DOB: [birth year], 48 y.o.   MRN: [id number]    Patient Name: Jade Farrell Date of Encounter: 01/04/2014  Primary Care Provider:  Cala BradfordWHITE,CYNTHIA S, MD Primary Cardiologist:  Lars MassonKatarina H Eevee Borbon  Problem List   Past Medical History  Diagnosis Date  . Anemia   . Migraine without aura   . Allergy   . Arthritis    Past Surgical History  Procedure Laterality Date  . Hemorrhoid surgery  1994    Removed   Allergies  No Known Allergies  HPI  A 48 year old female with no significant PMH who is coming with complains of chest pain. The pain at the right upper chest radiating to the right arm. They can happen at rest and on exertion, sharp and lasting few minutes. She has positive FH of CD In her father (MI in hs 9450) and paternal uncle who had PCI/stenting. 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 She otherwise denies SOB, Palpitations, orthopnea or PND. The patient has 5 children.   The patient underwent a cardiac catheterization for positive exercise treadmill stress test. Cardiac cath was negative for coronary artery disease. The patient is seeing her primary care physician who started her on medication for allergy and asthma as she relies that her chest pain develops when she has episodes of hives. She has noticed improvement on Singulair, ranitidine, and Zyrtec. The patient states that she doesn't exercise at all and feels short of breath just walking upstairs or  Home Medications  Prior to Admission medications   Medication Sig Start Date End Date Taking? Authorizing Provider  Ibuprofen (ADVIL PO) Take by mouth as needed.   Yes Historical Provider, MD  loratadine (CLARITIN) 10 MG tablet Take 10 mg by mouth daily.   Yes Historical Provider, MD  meloxicam (MOBIC) 15 MG tablet Take 15 mg by mouth daily.   Yes Historical Provider, MD  Multiple Vitamins-Minerals (MULTIVITAMIN PO) Take by mouth.   Yes Historical Provider, MD  Naproxen Sodium  (ALEVE PO) Take by mouth as needed.   Yes Historical Provider, MD  Vitamin D, Ergocalciferol, (DRISDOL) 50000 UNITS CAPS Take 50,000 Units by mouth.   Yes Historical Provider, MD  amoxicillin-clavulanate (AUGMENTIN) 875-125 MG per tablet Take 1 tablet by mouth 2 (two) times daily.    Historical Provider, MD  cyclobenzaprine (FLEXERIL) 10 MG tablet Take 10 mg by mouth 3 (three) times daily as needed for muscle spasms.    Historical Provider, MD  fluticasone (FLONASE) 50 MCG/ACT nasal spray Place 2 sprays into the nose daily. 12/27/11 12/26/12  Tonye Pearsonobert P Doolittle, MD  mometasone (NASONEX) 50 MCG/ACT nasal spray Place 2 sprays into the nose daily.    Historical Provider, MD  Norethindrone-Ethinyl Estradiol-Fe Biphas (LO LOESTRIN FE) 1 MG-10 MCG / 10 MCG tablet Take 1 tablet by mouth daily. 04/09/13   Bennye Almracy H Lathrop, MD  Norethindrone-Ethinyl Estradiol-Fe Biphas (LO LOESTRIN FE) 1 MG-10 MCG / 10 MCG tablet Take 1 tablet by mouth daily. 04/09/13   Bennye Almracy H Lathrop, MD  predniSONE (DELTASONE) 20 MG tablet 3/3/2/2/1/1Single daily dose for 6 days 12/27/11   Tonye Pearsonobert P Doolittle, MD    Family History  Family History  Problem Relation Age of Onset  . Diabetes Mother   . Hypertension Father   . Heart disease Father   . Breast cancer Sister [id number]  . Hypertension Sister   . Thyroid disease Sister   . Hypertension Brother   .  Heart disease Paternal Uncle     Social History  History   Social History  . Marital Status: Married    Spouse Name: N/A    Number of Children: N/A  . Years of Education: N/A   Occupational History  . TEACHER    Social History Main Topics  . Smoking status: Never Smoker   . Smokeless tobacco: Never Used  . Alcohol Use: No  . Drug Use: No  . Sexual Activity: Not on file   Other Topics Concern  . Not on file   Social History Narrative   Lives with husband.     Review of Systems, as per HPI, otherwise negative General:  No chills, fever, night sweats or weight changes.    Cardiovascular:  No chest pain, dyspnea on exertion, edema, orthopnea, palpitations, paroxysmal nocturnal dyspnea. Dermatological: No rash, lesions/masses Respiratory: No cough, dyspnea Urologic: No hematuria, dysuria Abdominal:   No nausea, vomiting, diarrhea, bright red blood per rectum, melena, or hematemesis Neurologic:  No visual changes, wkns, changes in mental status. All other systems reviewed and are otherwise negative except as noted above.  Physical Exam  Blood pressure 138/71, pulse 90, height 5\' 2"  (1.575 m), weight 181 lb (82.101 kg).  General: Pleasant, NAD Psych: Normal affect. Neuro: Alert and oriented X 3. Moves all extremities spontaneously. HEENT: Normal  Neck: Supple without bruits or JVD. Lungs:  Resp regular and unlabored, CTA. Heart: RRR no s3, s4, or murmurs. Abdomen: Soft, non-tender, non-distended, BS + x 4.  Extremities: No clubbing, cyanosis or edema. DP/PT/Radials 2+ and equal bilaterally.  Labs: None  Accessory Clinical Findings  Echocardiogram - 12/19/13 Left ventricle: The cavity size was normal. Wall thickness was normal. Systolic function was normal. The estimated ejection fraction was in the range of 60% to 65%. Wall motion was normal; there were no regional wall motion abnormalities. Left ventricular diastolic function parameters were normal. - Aortic valve: There was no stenosis. - Mitral valve: No regurgitation. - Right ventricle: The cavity size was normal. Systolic function was normal. - Tricuspid valve: Peak RV-RA gradient:49mm Hg (S). - Pulmonary arteries: PA peak pressure: 27mm Hg (S). - Inferior vena cava: The vessel was normal in size; the respirophasic diameter changes were in the normal range (= 50%); findings are consistent with normal central venous pressure. Impressions:  - Normal study.  ECG - normal sinus rhythm with sinus arrhythmia, right atrium enlargement, borderline EKG  Exercise tolerance stress test  12/20/2013 1. Reduced overal exercise tolerance 2. Limiting Angina (throat pain with dyspnea) 3. Ischemic ST Depressions in Lateral & Inferior Leads  Left cardiac cath 12/28/2013 Final Conclusions:  1. Normal coronary anatomy  2. Normal LV function  Recommendations: Medical management.    Assessment & Plan    A pleasant 48 year old female with atypical chest pain but risk factors that include FH of CAD, obesity. Positive stress test, negative cardiac catheterization, completely normal echocardiogram.  Blood pressure controlled, cholesterol borderline followed by primary care physician. The patient appears to be severely deconditioned, she school dean and also is raising 5 kids. We talked about importance of regular exercise and building her exercise capacity. She will follow with [place] in one year  Follow up in 1 month.  Lars Masson, MD, [facility] 01/04/2014, 8:22 AM

## 2014-01-15 ENCOUNTER — Encounter (INDEPENDENT_AMBULATORY_CARE_PROVIDER_SITE_OTHER): Payer: Self-pay

## 2014-01-15 ENCOUNTER — Ambulatory Visit
Admission: RE | Admit: 2014-01-15 | Discharge: 2014-01-15 | Disposition: A | Discharge status: Home / Self Care | Payer: BC Managed Care – PPO | Source: Ambulatory Visit | Attending: Family Medicine | Admitting: Family Medicine

## 2014-01-15 DIAGNOSIS — E049 Nontoxic goiter, unspecified: Secondary | ICD-10-CM

## 2014-07-29 ENCOUNTER — Encounter: Payer: Self-pay | Admitting: Cardiology

## 2014-09-05 ENCOUNTER — Encounter (HOSPITAL_COMMUNITY): Payer: Self-pay | Admitting: Cardiology

## 2015-01-06 ENCOUNTER — Ambulatory Visit: Payer: Self-pay | Admitting: Cardiology

## 2015-03-26 ENCOUNTER — Other Ambulatory Visit: Payer: Self-pay | Admitting: Family Medicine

## 2015-03-26 DIAGNOSIS — E041 Nontoxic single thyroid nodule: Secondary | ICD-10-CM | POA: Insufficient documentation

## 2015-03-27 ENCOUNTER — Other Ambulatory Visit: Payer: Self-pay | Admitting: Family Medicine

## 2015-03-27 ENCOUNTER — Ambulatory Visit
Admission: RE | Admit: 2015-03-27 | Discharge: 2015-03-27 | Disposition: A | Discharge status: Home / Self Care | Payer: BLUE CROSS/BLUE SHIELD | Source: Ambulatory Visit | Attending: Family Medicine | Admitting: Family Medicine

## 2015-03-27 DIAGNOSIS — M25551 Pain in right hip: Secondary | ICD-10-CM

## 2015-03-27 DIAGNOSIS — E041 Nontoxic single thyroid nodule: Secondary | ICD-10-CM

## 2015-04-21 ENCOUNTER — Other Ambulatory Visit: Payer: Self-pay

## 2015-04-21 DIAGNOSIS — Z1231 Encounter for screening mammogram for malignant neoplasm of breast: Secondary | ICD-10-CM

## 2015-04-23 ENCOUNTER — Ambulatory Visit
Admission: RE | Admit: 2015-04-23 | Discharge: 2015-04-23 | Disposition: A | Discharge status: Home / Self Care | Payer: BLUE CROSS/BLUE SHIELD | Source: Ambulatory Visit

## 2015-04-23 DIAGNOSIS — Z1231 Encounter for screening mammogram for malignant neoplasm of breast: Secondary | ICD-10-CM

## 2015-05-15 ENCOUNTER — Ambulatory Visit (INDEPENDENT_AMBULATORY_CARE_PROVIDER_SITE_OTHER): Payer: BLUE CROSS/BLUE SHIELD | Admitting: Endocrinology

## 2015-05-15 ENCOUNTER — Encounter: Payer: Self-pay | Admitting: Endocrinology

## 2015-05-15 VITALS — BP 126/82 | HR 74 | Temp 97.9°F | Resp 14 | Ht 62.0 in | Wt 177.2 lb

## 2015-05-15 DIAGNOSIS — E042 Nontoxic multinodular goiter: Secondary | ICD-10-CM

## 2015-05-15 LAB — TSH: TSH: 0.96 u[IU]/mL (ref 0.35–4.50)

## 2015-05-15 NOTE — Progress Notes (Signed)
Patient ID: Jade Farrell, female   DOB: 1966-09-19, 49 y.o.   MRN: [id number]           Reason for Appointment: Evaluation of thyroid nodule    History of Present Illness:   The patient's thyroid nodule was first discovered in 2012 by herself when she felt a firm area in her neck. He was not having any local discomfort at that time She did not however get evaluated with ultrasound until 2014   The thyroid ultrasound showed the following in 2014: Left lobe of thyroid larger than the right. Heterogeneously solid, right lateral isthmus, 2.1 x 1.1 x 1.7 cm, also other 1.2 cm or smaller nodules present  Needle aspiration of the dominant right isthmic nodule showed hyperplastic cells. Follow-up ultrasound in 2015 did not show significant change  The patient thinks that she is feeling a harder area on the right side of her thyroid but does not think her thyroid is enlarging.  Her PCP referred her for a follow-up ultrasound which was done in 6/16 and showed the following irregular bilobed solid nodule occupying the majority of the isthmus. The nodule today measures 29 x 14 x 19 mm compared to 25 x 13 x 17 mm previously Left lobe appeared slightly smaller with pseudo-nodule appearance     Lab Results  Component Value Date   TSH 0.96 05/15/2015      Medication List       This list is accurate as of: 05/15/15  9:19 PM.  Always use your most recent med list.               meloxicam 15 MG tablet  Commonly known as:  MOBIC  Take 15 mg by mouth daily as needed for pain.     MIRENA (52 MG) 20 MCG/24HR IUD  Generic drug:  levonorgestrel  as directed     montelukast 5 MG chewable tablet  Commonly known as:  SINGULAIR  Chew 5 mg by mouth at bedtime.     MULTIVITAMIN PO  Take 1 tablet by mouth daily.     ranitidine 150 MG tablet  Commonly known as:  ZANTAC  Take 150 mg by mouth 2 (two) times daily.     Vitamin D-3 5000 UNITS Tabs  Take 5,000 Units by mouth daily.     ZYRTEC  ALLERGY PO  Take 1 tablet by mouth 2 (two) times daily.        Allergies:  Allergies  Allergen Reactions  . Fluzone [Flu Virus Vaccine] Anaphylaxis and Swelling    Past Medical History  Diagnosis Date  . Anemia   . Migraine without aura   . Allergy   . Arthritis     There is no history of radiation to the neck in childhood  Past Surgical History  Procedure Laterality Date  . Hemorrhoid surgery  1994    Removed  . Left heart catheterization with coronary angiogram N/A 12/28/2013    Procedure: LEFT HEART CATHETERIZATION WITH CORONARY ANGIOGRAM;  Surgeon: Peter M Swaziland, MD;  Location: [facility] CATH LAB;  Service: Cardiovascular;  Laterality: N/A;    Family History  Problem Relation Age of Onset  . Diabetes Mother   . Hypertension Father   . Heart disease Father   . Breast cancer Sister 73  . Hypertension Sister   . Thyroid disease Sister     Goiter  . Hypertension Brother   . Heart disease Paternal Uncle     Social History:  reports that  she has never smoked. She has never used smokeless tobacco. She reports that she does not drink alcohol or use illicit drugs.   Review of Systems:  No history of recent weight change  No unusual fatigue, heat or cold intolerance or palpitations  There is no history of high blood pressure.             No history of Diabetes.      GI: No change in bowel habits          Examination:   BP 126/82 mmHg  Pulse 74  Temp(Src) 97.9 F (36.6 C)  Resp 14  Ht  (1.575 m)  Wt 177 lb 3.2 oz (80.377 kg)  BMI 32.40 kg/m2  SpO2 97%   General Appearance:  well-looking        Eyes: No prominence or swelling of the eyes         THYROID: She has a roughly 2.5 cm nodule felt in the midline in the isthmus, lower pole not clearly felt. Her right thyroid lobe is fairly firm and enlarged about twice normal.  Left lobe is enlarged about 2-2-1/2 times normal and a slightly firm but not nodular There is no lymphadenopathy in the neck  No  stridor, Pemberton sign negative Heart sounds normal Lungs clear Reflexes at biceps are normal.  Skin: No rash or lesions Extremities: No edema  Assessment/Plan:   Thyroid nodule: She has had some gradual increase in size of her dominant isthmic nodule since 2014 This had showed hyperplastic cells without any indication of malignancy Currently this feels smooth and uniform  She probably does have a multinodular goiter although may have underlying Hashimoto's thyroiditis judging from the texture of her thyroid especially on the right The patient is somewhat more concerned about the firm texture of her right upper thyroid pole but the right-sided does not show any nodule formation.  Discussed with the patient that although she had a benign biopsy previously it had shown hyperplastic cells and with increase in size may be reasonable to repeat the biopsy and she is agreeable to doing this.  Will also recheck her thyroid level today and check for TPO antibodies    [facility] 05/15/2015  Addendum: All labs are normal

## 2015-05-16 LAB — THYROID PEROXIDASE ANTIBODY

## 2015-05-16 NOTE — Progress Notes (Signed)
Quick Note:  Please let patient know that the lab results are normal ______ 

## 2015-05-19 ENCOUNTER — Telehealth: Payer: Self-pay | Admitting: Endocrinology

## 2015-05-19 NOTE — Telephone Encounter (Signed)
Pt calling to see why she was being call by the lab and by [facility]

## 2015-06-25 ENCOUNTER — Ambulatory Visit
Admission: RE | Admit: 2015-06-25 | Discharge: 2015-06-25 | Disposition: A | Discharge status: Home / Self Care | Payer: BLUE CROSS/BLUE SHIELD | Source: Ambulatory Visit | Attending: Endocrinology | Admitting: Endocrinology

## 2015-06-25 ENCOUNTER — Other Ambulatory Visit (HOSPITAL_COMMUNITY)
Admission: RE | Admit: 2015-06-25 | Discharge: 2015-06-25 | Disposition: A | Discharge status: Home / Self Care | Payer: BLUE CROSS/BLUE SHIELD | Source: Ambulatory Visit | Attending: Radiology | Admitting: Radiology

## 2015-06-25 DIAGNOSIS — E041 Nontoxic single thyroid nodule: Secondary | ICD-10-CM | POA: Insufficient documentation

## 2015-06-25 DIAGNOSIS — E042 Nontoxic multinodular goiter: Secondary | ICD-10-CM

## 2015-07-01 ENCOUNTER — Encounter: Payer: Self-pay | Admitting: *Deleted

## 2016-04-08 ENCOUNTER — Other Ambulatory Visit: Payer: Self-pay | Admitting: Family Medicine

## 2016-04-08 DIAGNOSIS — Z1231 Encounter for screening mammogram for malignant neoplasm of breast: Secondary | ICD-10-CM

## 2016-05-03 ENCOUNTER — Ambulatory Visit: Payer: BLUE CROSS/BLUE SHIELD

## 2016-05-04 ENCOUNTER — Other Ambulatory Visit: Payer: BLUE CROSS/BLUE SHIELD

## 2016-05-07 ENCOUNTER — Ambulatory Visit: Payer: BLUE CROSS/BLUE SHIELD | Admitting: Endocrinology

## 2016-05-10 ENCOUNTER — Ambulatory Visit
Admission: RE | Admit: 2016-05-10 | Discharge: 2016-05-10 | Disposition: A | Discharge status: Home / Self Care | Payer: BLUE CROSS/BLUE SHIELD | Source: Ambulatory Visit | Attending: Family Medicine | Admitting: Family Medicine

## 2016-05-10 DIAGNOSIS — Z1231 Encounter for screening mammogram for malignant neoplasm of breast: Secondary | ICD-10-CM

## 2016-05-14 ENCOUNTER — Other Ambulatory Visit: Payer: Self-pay | Admitting: Endocrinology

## 2016-05-14 DIAGNOSIS — E042 Nontoxic multinodular goiter: Secondary | ICD-10-CM

## 2016-05-19 ENCOUNTER — Other Ambulatory Visit: Payer: BLUE CROSS/BLUE SHIELD

## 2016-05-25 ENCOUNTER — Ambulatory Visit: Payer: BLUE CROSS/BLUE SHIELD | Admitting: Endocrinology

## 2016-07-09 ENCOUNTER — Other Ambulatory Visit (INDEPENDENT_AMBULATORY_CARE_PROVIDER_SITE_OTHER): Payer: Managed Care, Other (non HMO)

## 2016-07-09 DIAGNOSIS — E042 Nontoxic multinodular goiter: Secondary | ICD-10-CM | POA: Diagnosis not present

## 2016-07-09 LAB — T4, FREE: FREE T4: 0.73 ng/dL (ref 0.60–1.60)

## 2016-07-09 LAB — TSH: TSH: 1.01 u[IU]/mL (ref 0.35–4.50)

## 2016-07-13 ENCOUNTER — Ambulatory Visit (INDEPENDENT_AMBULATORY_CARE_PROVIDER_SITE_OTHER): Payer: Managed Care, Other (non HMO) | Admitting: Endocrinology

## 2016-07-13 ENCOUNTER — Encounter: Payer: Self-pay | Admitting: Endocrinology

## 2016-07-13 VITALS — BP 118/76 | HR 75 | Temp 98.3°F | Resp 16 | Ht 64.0 in | Wt 181.6 lb

## 2016-07-13 DIAGNOSIS — E042 Nontoxic multinodular goiter: Secondary | ICD-10-CM

## 2016-07-13 NOTE — Progress Notes (Signed)
Patient ID: Jade Farrell, female   DOB: 10-12-1965, 50 y.o.   MRN: [id number]           Reason for Appointment: Follow-up of thyroid nodule    History of Present Illness:   The patient's thyroid nodule was first discovered in 2012 by herself when she felt a firm area in her neck.  The thyroid ultrasound showed the following in 2014: Left lobe of thyroid larger than the right. Heterogeneously solid, right lateral isthmus, 2.1 x 1.1 x 1.7 cm, also other 1.2 cm or smaller nodules present Needle aspiration of the dominant right isthmic nodule showed hyperplastic cells. Follow-up ultrasound in 2015 did not show significant change  Another follow-up ultrasound which was done in 6/16  showed the following irregular bilobed solid nodule occupying the majority of the isthmus. The nodule  measures 29 x 14 x 19 mm compared to 25 x 13 x 17 mm previously Left lobe appeared slightly smaller with pseudo-nodule appearance Repeat BIOPSY showed scattered follicular cells and colloid  She feels a little pressure in her neck and throat area but no pain.   Lab Results  Component Value Date   FREET4 0.73 07/09/2016   TSH 1.01 07/09/2016   TSH 0.96 05/15/2015      Medication List       Accurate as of 07/13/16 11:05 AM. Always use your most recent med list.          fluticasone 50 MCG/ACT nasal spray Commonly known as:  FLONASE   meloxicam 15 MG tablet Commonly known as:  MOBIC Take 15 mg by mouth daily as needed for pain.   MIRENA (52 MG) 20 MCG/24HR IUD Generic drug:  levonorgestrel as directed   montelukast 5 MG chewable tablet Commonly known as:  SINGULAIR Chew 5 mg by mouth at bedtime.   MULTIVITAMIN PO Take 1 tablet by mouth daily.   ranitidine 150 MG tablet Commonly known as:  ZANTAC Take 150 mg by mouth 2 (two) times daily.   Vitamin D-3 5000 UNITS Tabs Take 5,000 Units by mouth daily.   ZYRTEC ALLERGY PO Take 1 tablet by mouth 2 (two) times daily.        Allergies:  Allergies  Allergen Reactions  . Fluzone [Flu Virus Vaccine] Anaphylaxis and Swelling    Past Medical History:  Diagnosis Date  . Allergy   . Anemia   . Arthritis   . Migraine without aura     There is no history of radiation to the neck in childhood  Past Surgical History:  Procedure Laterality Date  . HEMORRHOID SURGERY  1994   Removed  . LEFT HEART CATHETERIZATION WITH CORONARY ANGIOGRAM N/A 12/28/2013   Procedure: LEFT HEART CATHETERIZATION WITH CORONARY ANGIOGRAM;  Surgeon: Peter M Swaziland, MD;  Location: [facility] CATH LAB;  Service: Cardiovascular;  Laterality: N/A;    Family History  Problem Relation Age of Onset  . Diabetes Mother   . Hypertension Father   . Heart disease Father   . Breast cancer Sister 25  . Hypertension Sister   . Thyroid disease Sister     Goiter  . Hypertension Brother   . Heart disease Paternal Uncle     Social History:  reports that she has never smoked. She has never used smokeless tobacco. She reports that she does not drink alcohol or use drugs.   Review of Systems:  No history of recent weight change  No unusual fatigue, heat or cold intolerance or palpitations  There is  no history of high blood pressure.             No history of Diabetes.      GI: No change in bowel habits          Examination:   BP 118/76   Pulse 75   Temp 98.3 F (36.8 C)   Resp 16   Ht 5\' 4"  (1.626 m)   Wt 181 lb 9.6 oz (82.4 kg)   SpO2 98%   BMI 31.17 kg/m          THYROID: She has a Relatively irregular-shaped 2.5 cm nodule felt in the midline in the isthmus towards the right and very firm Her right thyroid lobe is  firm and enlarged Only about 1-1/2 times normal in the left side is enlarged about twice normal and firm are in the lateral part  Assessment/Plan:   MULTINODULAR goiter: Her thyroid overall appears to be smaller on the lateral lobes. Nodule on the right side of the isthmus feels about the same in size as last  year Reassured her that her thyroid has been evaluated twice with biopsies and will not need further evaluation  Thyroid function studies are consistently normal She will follow-up in one year    [facility] Hospital[facility]UM[facility],Rasha Ibe 07/13/2016

## 2016-07-21 ENCOUNTER — Telehealth: Payer: Self-pay | Admitting: Genetic Counselor

## 2016-07-21 ENCOUNTER — Encounter: Payer: Self-pay | Admitting: Genetic Counselor

## 2016-07-21 NOTE — Telephone Encounter (Signed)
Pt confirmed appt, completed intake, mailed pt letter, faxed appt date/time to referring provider °

## 2016-09-16 ENCOUNTER — Other Ambulatory Visit: Payer: Managed Care, Other (non HMO)

## 2016-09-16 ENCOUNTER — Encounter: Payer: Managed Care, Other (non HMO) | Admitting: Genetic Counselor

## 2017-04-29 ENCOUNTER — Ambulatory Visit
Admission: RE | Admit: 2017-04-29 | Discharge: 2017-04-29 | Disposition: A | Discharge status: Home / Self Care | Payer: Managed Care, Other (non HMO) | Source: Ambulatory Visit | Attending: Family Medicine | Admitting: Family Medicine

## 2017-04-29 ENCOUNTER — Other Ambulatory Visit: Payer: Self-pay | Admitting: Family Medicine

## 2017-04-29 DIAGNOSIS — M25562 Pain in left knee: Secondary | ICD-10-CM

## 2017-04-29 DIAGNOSIS — M2560 Stiffness of unspecified joint, not elsewhere classified: Secondary | ICD-10-CM

## 2017-06-09 ENCOUNTER — Other Ambulatory Visit: Payer: Self-pay | Admitting: Family Medicine

## 2017-06-09 DIAGNOSIS — Z1231 Encounter for screening mammogram for malignant neoplasm of breast: Secondary | ICD-10-CM

## 2017-06-14 ENCOUNTER — Ambulatory Visit
Admission: RE | Admit: 2017-06-14 | Discharge: 2017-06-14 | Disposition: A | Discharge status: Home / Self Care | Payer: Managed Care, Other (non HMO) | Source: Ambulatory Visit | Attending: Family Medicine | Admitting: Family Medicine

## 2017-06-14 DIAGNOSIS — Z1231 Encounter for screening mammogram for malignant neoplasm of breast: Secondary | ICD-10-CM

## 2017-08-03 ENCOUNTER — Ambulatory Visit: Payer: Managed Care, Other (non HMO) | Admitting: Obstetrics and Gynecology

## 2017-08-03 ENCOUNTER — Other Ambulatory Visit: Payer: Self-pay

## 2017-08-03 ENCOUNTER — Encounter: Payer: Self-pay | Admitting: Obstetrics and Gynecology

## 2017-08-03 ENCOUNTER — Other Ambulatory Visit (HOSPITAL_COMMUNITY)
Admission: RE | Admit: 2017-08-03 | Discharge: 2017-08-03 | Disposition: A | Discharge status: Home / Self Care | Payer: Managed Care, Other (non HMO) | Source: Ambulatory Visit | Attending: Obstetrics and Gynecology | Admitting: Obstetrics and Gynecology

## 2017-08-03 VITALS — BP 122/80 | HR 80 | Resp 16 | Ht 61.0 in | Wt 186.0 lb

## 2017-08-03 DIAGNOSIS — Z30431 Encounter for routine checking of intrauterine contraceptive device: Secondary | ICD-10-CM

## 2017-08-03 DIAGNOSIS — Z01419 Encounter for gynecological examination (general) (routine) without abnormal findings: Secondary | ICD-10-CM | POA: Diagnosis not present

## 2017-08-03 DIAGNOSIS — Z8742 Personal history of other diseases of the female genital tract: Secondary | ICD-10-CM | POA: Diagnosis not present

## 2017-08-03 DIAGNOSIS — Z124 Encounter for screening for malignant neoplasm of cervix: Secondary | ICD-10-CM

## 2017-08-03 DIAGNOSIS — N912 Amenorrhea, unspecified: Secondary | ICD-10-CM

## 2017-08-03 DIAGNOSIS — Z803 Family history of malignant neoplasm of breast: Secondary | ICD-10-CM

## 2017-08-03 NOTE — Patient Instructions (Signed)
EXERCISE AND DIET:  We recommended that you start or continue a regular exercise program for good health. Regular exercise means any activity that makes your heart beat faster and makes you sweat.  We recommend exercising at least 30 minutes per day at least 3 days a week, preferably 4 or 5.  We also recommend a diet low in fat and sugar.  Inactivity, poor dietary choices and obesity can cause diabetes, heart attack, stroke, and kidney damage, among others.    ALCOHOL AND SMOKING:  Women should limit their alcohol intake to no more than 7 drinks/beers/glasses of wine (combined, not each!) per week. Moderation of alcohol intake to this level decreases your risk of breast cancer and liver damage. And of course, no recreational drugs are part of a healthy lifestyle.  And absolutely no smoking or even second hand smoke. Most people know smoking can cause heart and lung diseases, but did you know it also contributes to weakening of your bones? Aging of your skin?  Yellowing of your teeth and nails?  CALCIUM AND VITAMIN D:  Adequate intake of calcium and Vitamin D are recommended.  The recommendations for exact amounts of these supplements seem to change often, but generally speaking 600 mg of calcium (either carbonate or citrate) and 800 units of Vitamin D per day seems prudent. Certain women may benefit from higher intake of Vitamin D.  If you are among these women, your doctor will have told you during your visit.    PAP SMEARS:  Pap smears, to check for cervical cancer or precancers,  have traditionally been done yearly, although recent scientific advances have shown that most women can have pap smears less often.  However, every woman still should have a physical exam from her gynecologist every year. It will include a breast check, inspection of the vulva and vagina to check for abnormal growths or skin changes, a visual exam of the cervix, and then an exam to evaluate the size and shape of the uterus and  ovaries.  And after 51 years of age, a rectal exam is indicated to check for rectal cancers. We will also provide age appropriate advice regarding health maintenance, like when you should have certain vaccines, screening for sexually transmitted diseases, bone density testing, colonoscopy, mammograms, etc.   MAMMOGRAMS:  All women over 40 years old should have a yearly mammogram. Many facilities now offer a "3D" mammogram, which may cost around $50 extra out of pocket. If possible,  we recommend you accept the option to have the 3D mammogram performed.  It both reduces the number of women who will be called back for extra views which then turn out to be normal, and it is better than the routine mammogram at detecting truly abnormal areas.    COLONOSCOPY:  Colonoscopy to screen for colon cancer is recommended for all women at age 50.  We know, you hate the idea of the prep.  We agree, BUT, having colon cancer and not knowing it is worse!!  Colon cancer so often starts as a polyp that can be seen and removed at colonscopy, which can quite literally save your life!  And if your first colonoscopy is normal and you have no family history of colon cancer, most women don't have to have it again for 10 years.  Once every ten years, you can do something that may end up saving your life, right?  We will be happy to help you get it scheduled when you are ready.    Be sure to check your insurance coverage so you understand how much it will cost.  It may be covered as a preventative service at no cost, but you should check your particular policy.      Breast Self-Awareness Breast self-awareness means being familiar with how your breasts look and feel. It involves checking your breasts regularly and reporting any changes to your health care provider. Practicing breast self-awareness is important. A change in your breasts can be a sign of a serious medical problem. Being familiar with how your breasts look and feel allows  you to find any problems early, when treatment is more likely to be successful. All women should practice breast self-awareness, including women who have had breast implants. How to do a breast self-exam One way to learn what is normal for your breasts and whether your breasts are changing is to do a breast self-exam. To do a breast self-exam: Look for Changes  1. Remove all the clothing above your waist. 2. Stand in front of a mirror in a room with good lighting. 3. Put your hands on your hips. 4. Push your hands firmly downward. 5. Compare your breasts in the mirror. Look for differences between them (asymmetry), such as: ? Differences in shape. ? Differences in size. ? Puckers, dips, and bumps in one breast and not the other. 6. Look at each breast for changes in your skin, such as: ? Redness. ? Scaly areas. 7. Look for changes in your nipples, such as: ? Discharge. ? Bleeding. ? Dimpling. ? Redness. ? A change in position. Feel for Changes  Carefully feel your breasts for lumps and changes. It is best to do this while lying on your back on the floor and again while sitting or standing in the shower or tub with soapy water on your skin. Feel each breast in the following way:  Place the arm on the side of the breast you are examining above your head.  Feel your breast with the other hand.  Start in the nipple area and make  inch (2 cm) overlapping circles to feel your breast. Use the pads of your three middle fingers to do this. Apply light pressure, then medium pressure, then firm pressure. The light pressure will allow you to feel the tissue closest to the skin. The medium pressure will allow you to feel the tissue that is a little deeper. The firm pressure will allow you to feel the tissue close to the ribs.  Continue the overlapping circles, moving downward over the breast until you feel your ribs below your breast.  Move one finger-width toward the center of the body.  Continue to use the  inch (2 cm) overlapping circles to feel your breast as you move slowly up toward your collarbone.  Continue the up and down exam using all three pressures until you reach your armpit.  Write Down What You Find  Write down what is normal for each breast and any changes that you find. Keep a written record with breast changes or normal findings for each breast. By writing this information down, you do not need to depend only on memory for size, tenderness, or location. Write down where you are in your menstrual cycle, if you are still menstruating. If you are having trouble noticing differences in your breasts, do not get discouraged. With time you will become more familiar with the variations in your breasts and more comfortable with the exam. How often should I examine my breasts? Examine   your breasts every month. If you are breastfeeding, the best time to examine your breasts is after a feeding or after using a breast pump. If you menstruate, the best time to examine your breasts is 5-7 days after your period is over. During your period, your breasts are lumpier, and it may be more difficult to notice changes. When should I see my health care provider? See your health care provider if you notice:  A change in shape or size of your breasts or nipples.  A change in the skin of your breast or nipples, such as a reddened or scaly area.  Unusual discharge from your nipples.  A lump or thick area that was not there before.  Pain in your breasts.  Anything that concerns you.  This information is not intended to replace advice given to you by your health care provider. Make sure you discuss any questions you have with your health care provider. Document Released: 09/13/2005 Document Revised: 02/19/2016 Document Reviewed: 08/03/2015 Elsevier Interactive Patient Education  2018 [employer]  

## 2017-08-03 NOTE — Progress Notes (Signed)
51 y.o. [id number] MarriedAfrican AmericanF here for annual exam.  She has a mirena IUD due to come out this year. No cycles currently. She has intermittent spotting, can go 3-4 months without bleeding. No hot flashes, no night sweats, no vaginal dryness. She went on the mirena for excessive bleeding.     No LMP recorded. Patient is not currently having periods (Reason: IUD).          Sexually active: No.  The current method of family planning is IUD (Mirena)Expired.    Exercising: No.  The patient does not participate in regular exercise at present. Smoker:  no  Health Maintenance: Pap:  unsure History of abnormal Pap:  no MMG:  06-14-17 WNL  Colonoscopy:  2018 WNL per patient  BMD:   Never TDaP:  09-22-12 Gardasil: N/A   reports that  has never smoked. she has never used smokeless tobacco. 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  Kids are 25, 22, 20, 13 and 12. She is originally from [place].   Past Medical History:  Diagnosis Date  . Allergy   . Anemia   . Arthritis   . Migraine without aura   . Rheumatoid arteritis   . Vitamin D deficiency     Past Surgical History:  Procedure Laterality Date  . HEMORRHOID SURGERY  1994   Removed    Current Outpatient Medications  Medication Sig Dispense Refill  . Calcium Carbonate (CALCIUM 600 PO) Take by mouth.    . Cholecalciferol (VITAMIN D-3) 5000 UNITS TABS Take 5,000 Units by mouth daily.    Marland Kitchen. levonorgestrel (MIRENA, 52 MG,) 20 MCG/24HR IUD as directed    . Multiple Vitamins-Minerals (MULTIVITAMIN PO) Take 1 tablet by mouth daily.      No current facility-administered medications for this visit.     Family History  Problem Relation Age of Onset  . Diabetes Mother   . Hypertension Father   . Heart disease Father   . Heart disease Paternal Uncle   . Breast cancer Sister [id number]  . Hypertension Sister   . Thyroid disease Sister    Goiter  . Hypertension Brother   . Rheum arthritis Brother   . Breast cancer Sister [id number]  . Diabetes Sister   2 out of 6 of her sisters have breast cancer, the older one was menopausal. No genetic testing.   Review of Systems  Constitutional: Negative.   HENT: Negative.   Eyes: Negative.   Respiratory: Negative.   Cardiovascular: Negative.   Gastrointestinal: Negative.   Endocrine: Negative.   Genitourinary: Negative.   Musculoskeletal: Negative.   Skin: Negative.   Allergic/Immunologic: Negative.   Neurological: Negative.   Psychiatric/Behavioral: Negative.     Exam:   BP 122/80 (BP Location: Right Arm, Patient Position: Sitting, Cuff Size: Large)   Pulse 80   Resp 16   Ht 5\' 1"  (1.549 m)   Wt 186 lb (84.4 kg)   BMI 35.14 kg/m   Weight change: @WEIGHTCHANGE @ Height:   Height: 5\' 1"  (154.9 cm)  Ht Readings from Last 3 Encounters:  08/03/17 5\' 1"  (1.549 m)  07/13/16 5\' 4"  (1.626 m)  05/15/15 5\' 2"  (1.575 m)    General appearance: alert, cooperative and appears stated age Head: Normocephalic, without obvious abnormality, atraumatic Neck: no adenopathy, supple, symmetrical, trachea midline and thyroid normal to inspection and palpation Lungs: clear to auscultation bilaterally Cardiovascular: regular rate and rhythm Breasts:  normal appearance, no masses or tenderness Abdomen: soft, non-tender; non distended,  no masses,  no organomegaly Extremities: extremities normal, atraumatic, no cyanosis or edema Skin: Skin color, texture, turgor normal. No rashes or lesions Lymph nodes: Cervical, supraclavicular, and axillary nodes normal. No abnormal inguinal nodes palpated Neurologic: Grossly normal   Pelvic: External genitalia:  no lesions, clitoris not seen tissue fused in the midline (c/w female circumcision)              Urethra:  normal appearing urethra with no masses, tenderness or lesions              Bartholins and Skenes: normal                 Vagina: normal  appearing vagina with normal color and discharge, no lesions              Cervix: no lesions and IUD string 3-4 cm               Bimanual Exam:  Uterus:  normal size, contour, position, consistency, mobility, non-tender and anteverted              Adnexa: no mass, fullness, tenderness               Rectovaginal: Confirms               Anus:  normal sphincter tone, no lesions  Chaperone was present for exam.  A:  Well Woman with normal exam  Family history of breast cancer in 2 of her 6 sisters.   H/O Menorrhagia, helped with the IUD  P:   Pap with hpv  FSH today, may help with decisions with her mirena  She wants to continue with this mirena, aware it may not have the contraceptive effectiveness  Screening labs with primary  Will get her up for a genetic referral  Discussed breast self exam  Discussed calcium and vit D intake

## 2017-08-04 LAB — FOLLICLE STIMULATING HORMONE: FSH: 112 m[IU]/mL

## 2017-08-05 LAB — CYTOLOGY - PAP
Diagnosis: NEGATIVE
HPV: NOT DETECTED

## 2017-08-08 ENCOUNTER — Telehealth: Payer: Self-pay

## 2017-08-08 NOTE — Telephone Encounter (Signed)
-----   Message from Romualdo BolkJill Evelyn Jertson, MD sent at 08/04/2017 12:09 PM EST ----- Please let the patient know [facility], which means she is either peri or postmenopausal. She can leave the IUD in for another year if she likes or pull it.

## 2017-08-08 NOTE — Telephone Encounter (Signed)
Left message to call Jonaya Freshour at [phone]. 

## 2017-08-16 IMAGING — DX DG KNEE COMPLETE 4+V*L*
4 series · 4 of 4 positions shown · non-contrast
Comparison: None.

CLINICAL DATA: 50-year-old female with left lateral knee pain for
the past 3 days. No known injury

EXAM:
LEFT KNEE - COMPLETE 4+ VIEW

[dg knee complete 4 views left (1 of 4)]
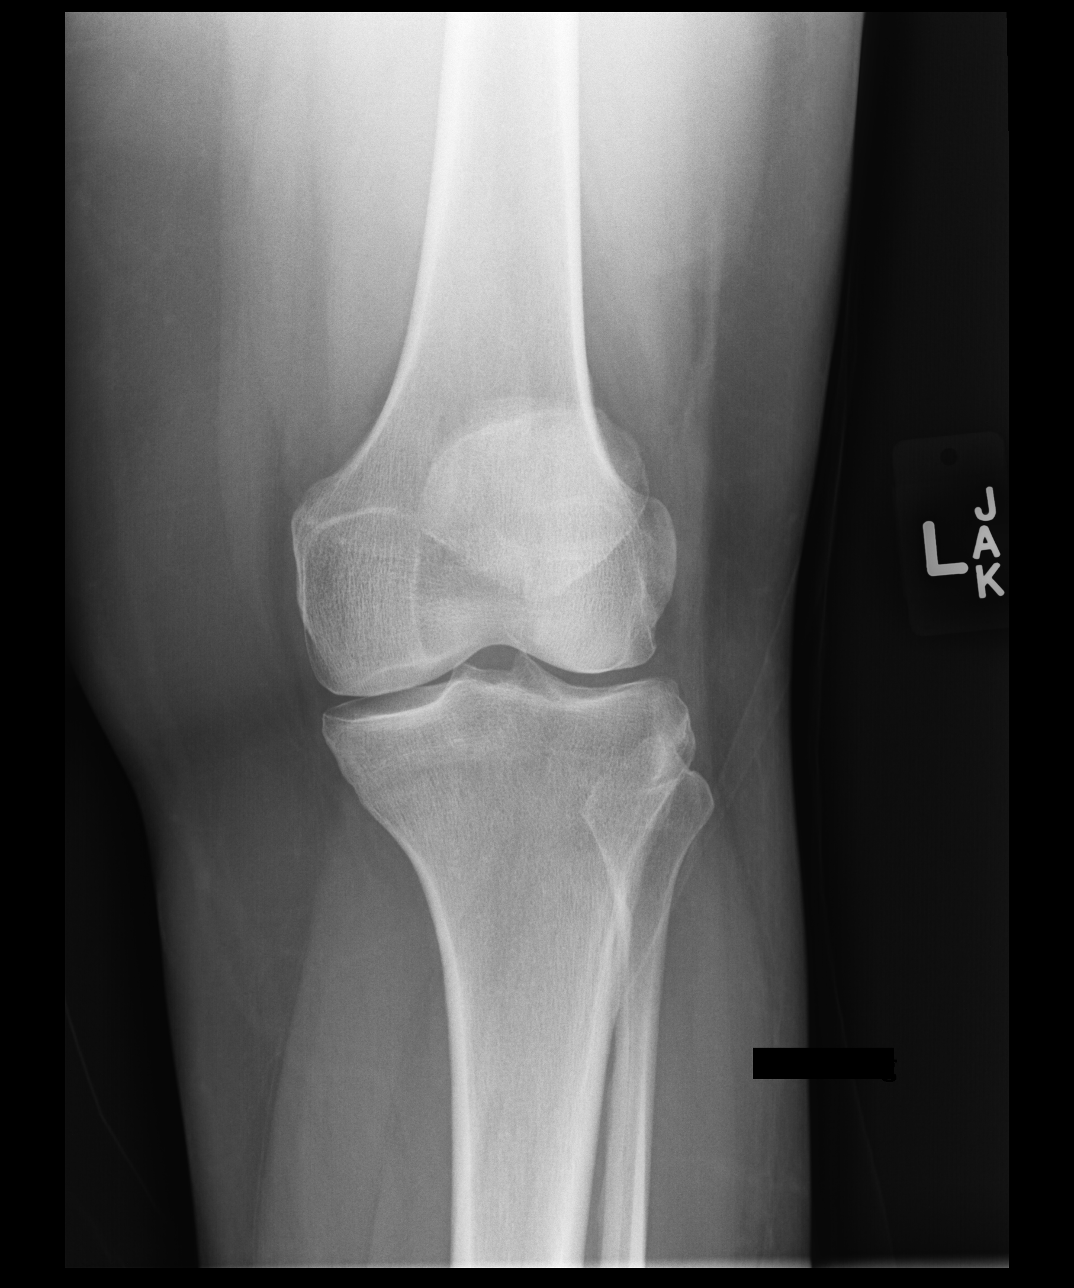

[dg knee complete 4 views left (2 of 4)]
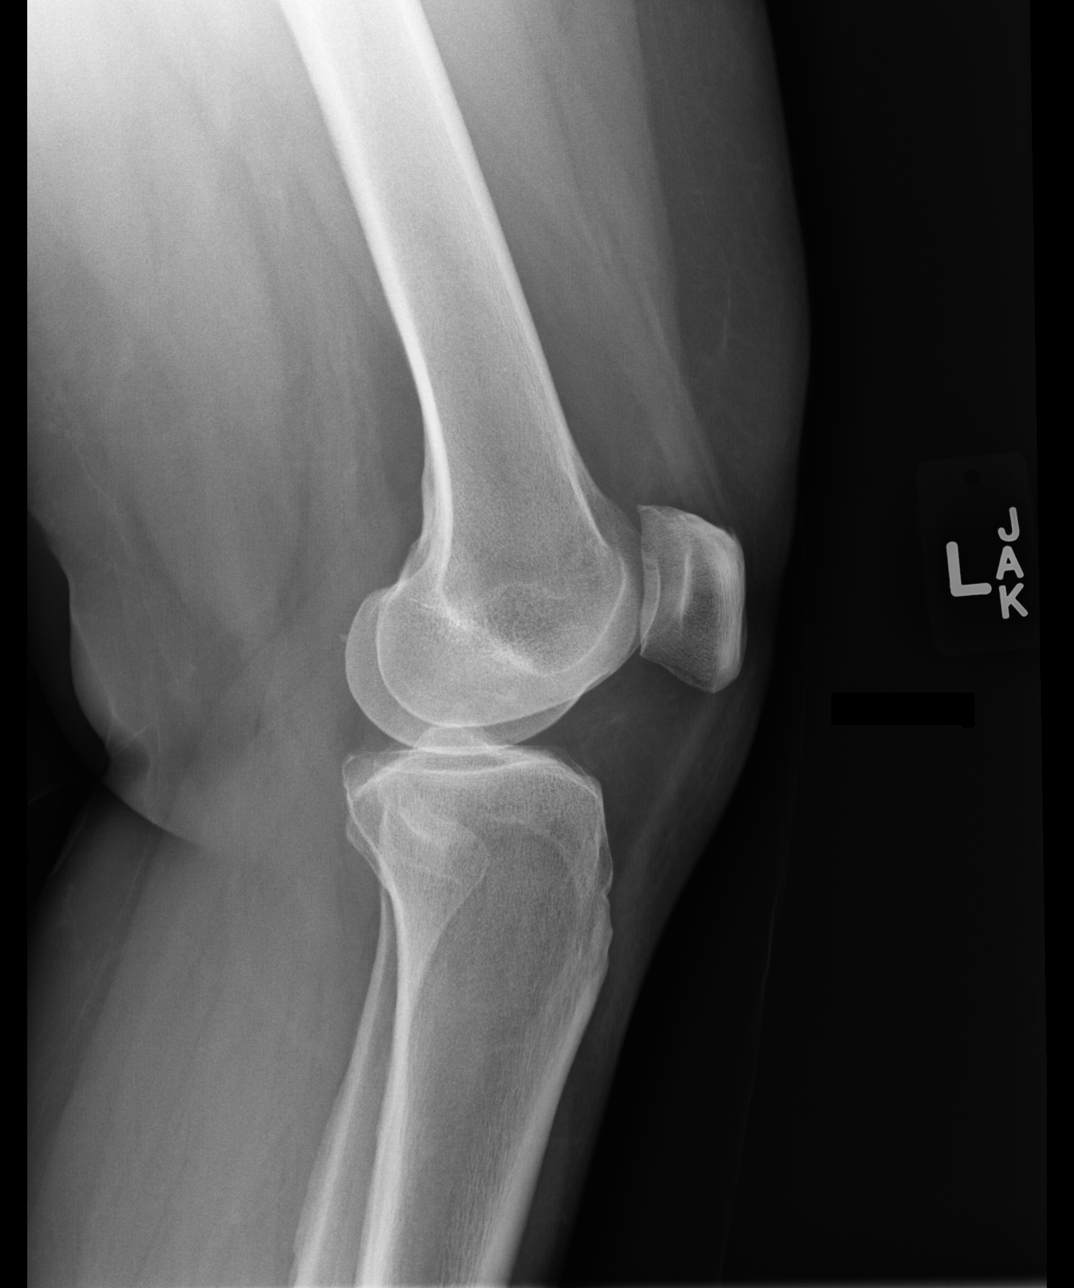

[dg knee complete 4 views left (3 of 4)]
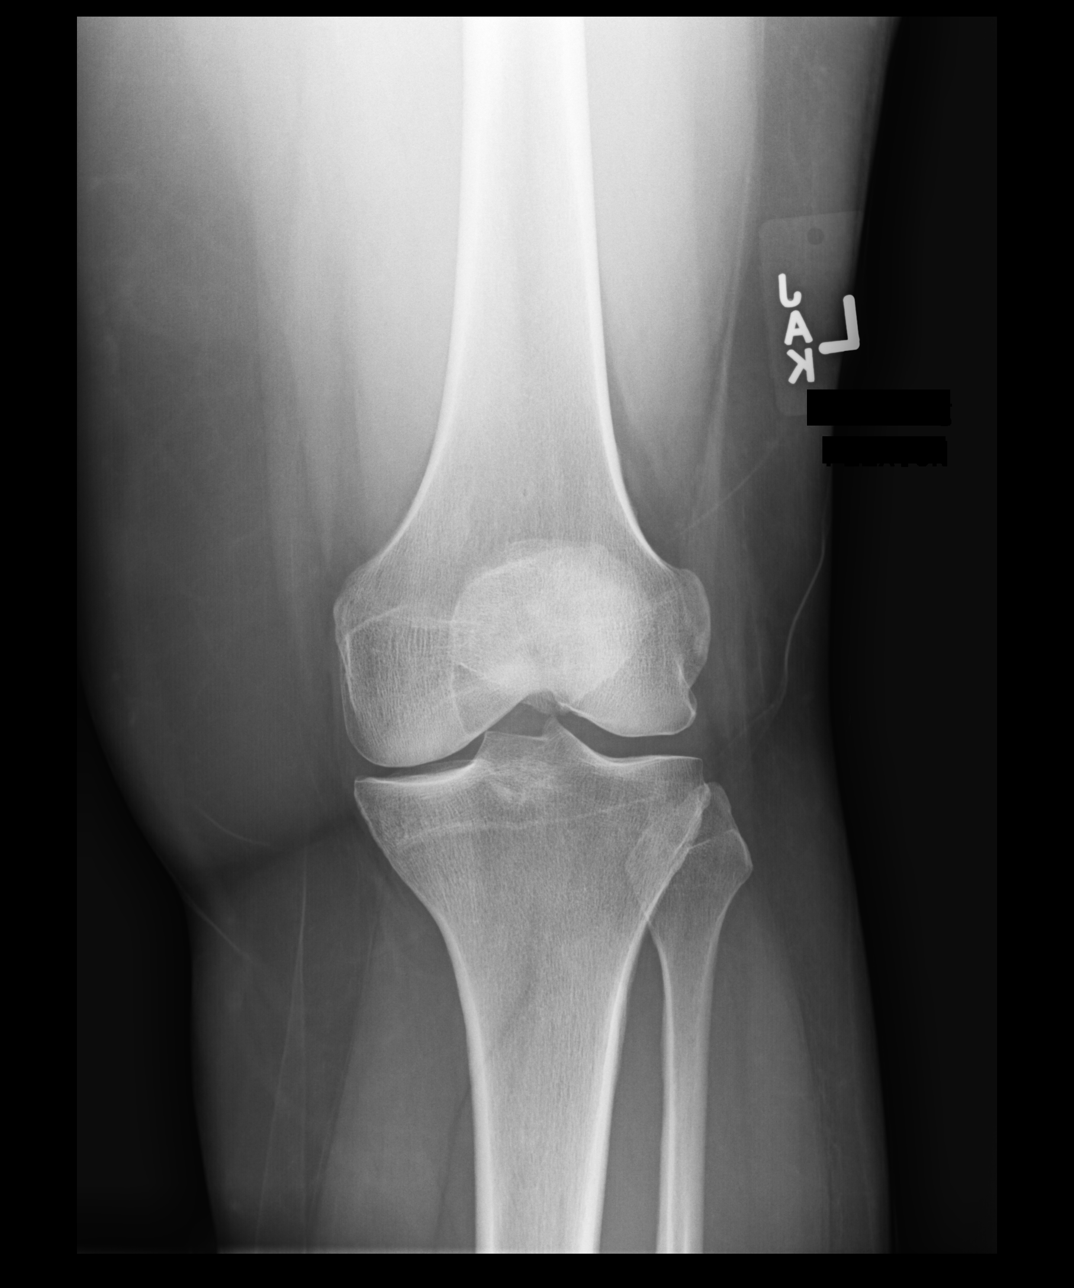

[dg knee complete 4 views left (4 of 4)]
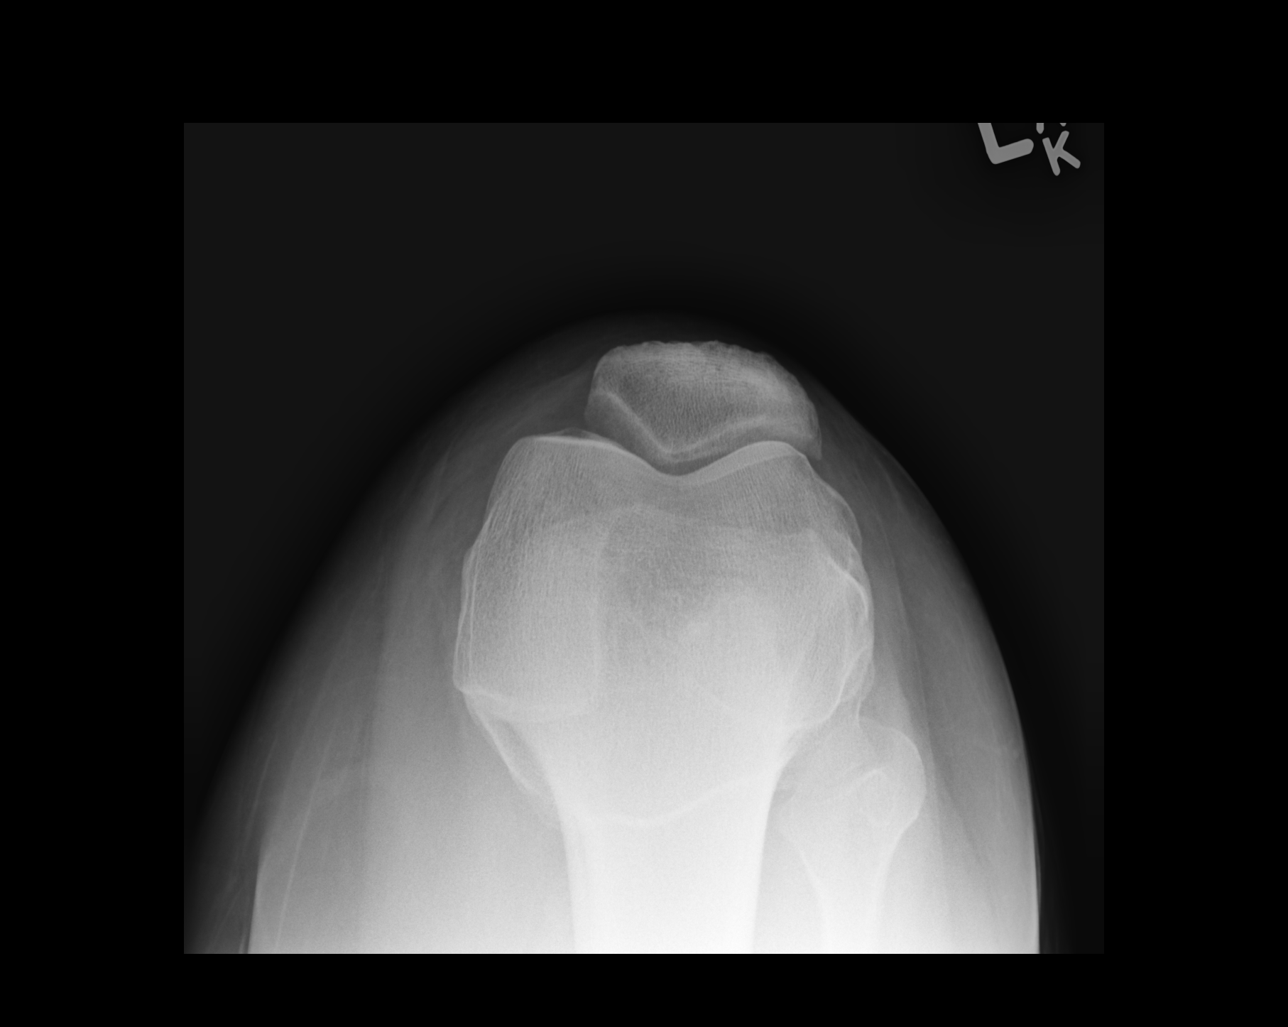

[4 of 4 positions shown; findings below may reference images not displayed]

FINDINGS: No evidence of acute fracture, malalignment or knee joint effusion.
Normal bony mineralization. No lytic or blastic osseous lesion.
Minimal narrowing of the medial compartmental joint space. No
significant osteophyte production or other degenerative changes.
IMPRESSION: 1. Slight narrowing of the medial compartmental joint space.
2. Otherwise, negative knee radiographs.

## 2017-08-16 NOTE — Telephone Encounter (Signed)
Notes recorded by Zaidy Absher, Caroleen HammanKaitlyn E, RN on 08/09/2017 at 8:56 AM EST Spoke with patient. Results given. Patient would like to leave her IUD in for another year.Encounter closed.

## 2017-09-02 ENCOUNTER — Encounter: Payer: Self-pay | Admitting: Genetic Counselor

## 2017-09-02 ENCOUNTER — Telehealth: Payer: Self-pay | Admitting: Genetic Counselor

## 2017-09-02 NOTE — Telephone Encounter (Signed)
A genetic counseling appt has been scheduled for the pt to see Ofri on 12/21 at 115pm. Pt aware to arrive 30 minutes early. Letter mailed.

## 2017-09-16 ENCOUNTER — Encounter: Payer: Self-pay | Admitting: Genetic Counselor

## 2017-09-16 ENCOUNTER — Ambulatory Visit (HOSPITAL_BASED_OUTPATIENT_CLINIC_OR_DEPARTMENT_OTHER): Payer: Managed Care, Other (non HMO) | Admitting: Genetic Counselor

## 2017-09-16 ENCOUNTER — Other Ambulatory Visit: Payer: Managed Care, Other (non HMO)

## 2017-09-16 DIAGNOSIS — Z315 Encounter for genetic counseling: Secondary | ICD-10-CM | POA: Diagnosis not present

## 2017-09-16 DIAGNOSIS — Z809 Family history of malignant neoplasm, unspecified: Secondary | ICD-10-CM

## 2017-09-16 DIAGNOSIS — Z803 Family history of malignant neoplasm of breast: Secondary | ICD-10-CM | POA: Diagnosis not present

## 2017-09-16 DIAGNOSIS — Z1379 Encounter for other screening for genetic and chromosomal anomalies: Secondary | ICD-10-CM

## 2017-09-16 HISTORY — DX: Encounter for other screening for genetic and chromosomal anomalies: Z13.79

## 2017-09-16 NOTE — Progress Notes (Signed)
[facility]      Initial Visit   Patient Name: Jade Farrell Patient DOB: September 03, 1966 Patient Age: 51 y.o. Encounter Date: 09/16/2017  Referring Provider: Sumner Boast, MD  Primary Care Provider: Harlan Stains, MD  Reason for Visit: Evaluate for hereditary susceptibility to cancer    Assessment and Plan:  . Jade Farrell's family history is not highly suggestive of a hereditary predisposition to cancer even though two sisters had breast cancer because she has a very large family with many unaffected maternal and paternal female relatives. However, given that her sister who had breast cancer at age 47 is not willing to pursue testing herself, testing Jade Farrell is reasonable.  . Testing is recommended to determine whether she has a pathogenic mutation that will impact her screening and risk-reduction for cancer. A negative result will be reassuring.  . Jade Farrell wished to pursue genetic testing and a blood sample will be sent for analysis of the 83 genes on [facility]'s Multi-Cancer panel (ALK, APC, ATM, AXIN2, BAP1, BARD1, BLM, BMPR1A, BRCA1, BRCA2, BRIP1, CASR, CDC73, CDH1, CDK4, CDKN1B, CDKN1C, CDKN2A, CEBPA, CHEK2, CTNNA1, DICER1, DIS3L2, EGFR, EPCAM, FH, FLCN, GATA2, GPC3, GREM1, HOXB13, HRAS, KIT, MAX, MEN1, MET, MITF, MLH1, MSH2, MSH3, MSH6, MUTYH, NBN, NF1, NF2, NTHL1, PALB2, PDGFRA, PHOX2B, PMS2, POLD1, POLE, POT1, PRKAR1A, PTCH1, PTEN, RAD50, RAD51C, RAD51D, RB1, RECQL4, RET, RUNX1, SDHA, SDHAF2, SDHB, SDHC, SDHD, SMAD4, SMARCA4, SMARCB1, SMARCE1, STK11, SUFU, TERC, TERT, TMEM127, TP53, TSC1, TSC2, VHL, WRN, WT1).   . Results should be available in approximately 2-4 weeks, at which point we will contact her and address implications for her as well as address genetic testing for at-risk family members, if needed.     Dr. Jana Farrell was available for questions concerning this case. Total time spent by me in face-to-face counseling was approximately 35 minutes.     _____________________________________________________________________   History of Present Illness: Jade Farrell, a 51 y.o. female, is being seen at the [facility] due to a family history of breast cancer. She presents to [facility] today to discuss the possibility of a hereditary predisposition to cancer and discuss whether genetic testing is warranted.  Jade Farrell has no personal history of cancer. She undergoes a yearly mammogram, clinical breast exam and gynecologic exam.  She reported having a negative colonoscopy this year.  Past Medical History:  Diagnosis Date  . Allergy   . Anemia   . Arthritis   . Family history of breast cancer   . Migraine without aura   . Rheumatoid arteritis   . Vitamin D deficiency     Past Surgical History:  Procedure Laterality Date  . [place]   Removed  . LEFT HEART CATHETERIZATION WITH CORONARY ANGIOGRAM N/A 12/28/2013   Procedure: LEFT HEART CATHETERIZATION WITH CORONARY ANGIOGRAM;  Surgeon: Peter M Martinique, MD;  Location: [facility] CATH LAB;  Service: Cardiovascular;  Laterality: N/A;    Social History   Socioeconomic History  . Marital status: Married    Spouse name: Not on file  . Number of children: Not on file  . Years of education: Not on file  . Highest education level: Not on file  Social Needs  . Financial resource strain: Not on file  . Food insecurity - worry: Not on file  . Food insecurity - inability: Not on file  . Transportation needs - medical: Not on file  . Transportation needs - non-medical: Not on  file  Occupational History  . Occupation: TEACHER  Tobacco Use  . Smoking status: Never Smoker  . Smokeless tobacco: Never Used  Substance and Sexual Activity  . Alcohol use: No  . Drug use: No  . Sexual activity: No    Birth control/protection: None  Other Topics Concern  . Not on file  Social History Narrative   Lives with husband.     Family History:  During the visit, a  4-generation pedigree was obtained. Family tree will be scanned in the Media tab in Epic  Significant diagnoses include the following:  Family History  Problem Relation Age of Onset  . Diabetes Mother   . Hypertension Father   . Heart disease Father   . Heart disease Paternal Uncle   . Breast cancer Sister 14       currenlty 23  . Hypertension Sister   . Thyroid disease Sister        Goiter  . Hypertension Brother   . Rheum arthritis Brother   . Breast cancer Sister 20       currently 83  . Diabetes Sister   . Cancer Paternal Aunt        "abdominal ca"; deceased 59    Additionally, Jade Farrell has 4 daughters and one son. She has a total of 6 sisters and a brother.  Her mother died at 69, cancer-free. She had 5 brothers and 4 sisters. Her father died at 80, cancer-free. He had a sister and 2 brothers.  Jade Farrell ancestry is [place]. There is no known Jewish ancestry and no consanguinity.  Discussion: We reviewed the characteristics, features and inheritance patterns of hereditary cancer syndromes. We discussed her risk of harboring a mutation in the context of her personal and family history.  We discussed the process of genetic testing, insurance coverage and implications of results: positive, negative and variant of unknown significance (VUS).    Jade Farrell questions were answered to her satisfaction today and she is welcome to call with any additional questions or concerns. Thank you for the referral and allowing [place] to share in the care of your patient.    Jade Berg, MS, [place] Certified Genetic Counselor phone: [phone] [email or web address]   ______________________________________________________________________ For Office Staff:  Number of people involved in session: 1 Was an Intern/ student involved with case: no

## 2017-09-26 ENCOUNTER — Encounter: Payer: Self-pay | Admitting: Genetic Counselor

## 2017-09-26 ENCOUNTER — Ambulatory Visit: Payer: Self-pay | Admitting: Genetic Counselor

## 2017-09-26 DIAGNOSIS — Z1379 Encounter for other screening for genetic and chromosomal anomalies: Secondary | ICD-10-CM

## 2017-09-26 NOTE — Progress Notes (Addendum)
Cancer Genetics Clinic       Genetic Test Results    Patient Name: Jade Farrell Patient DOB: July 25, 1966 Patient Age: 51 y.o. Encounter Date: 09/26/2017  Referring Provider: Sumner Boast, MD  Primary Care Provider: Harlan Stains, MD   Jade Farrell was called today to discuss genetic test results. Please see the Genetics note from her visit on 09/16/2017 for a detailed discussion of her personal and family history.  Genetic Testing: At the time of Jade Farrell's visit, she decided to pursue genetic testing of multiple genes associated with hereditary susceptibility to cancer. Testing included sequencing and deletion/duplication analysis. Testing did not reveal any pathogenic mutation in any of these genes.  A copy of the genetic test report will be scanned into Epic under the media tab.  The genes analyzed were the 83 genes on [facility]'s Multi-Cancer panel (ALK, APC, ATM, AXIN2, BAP1, BARD1, BLM, BMPR1A, BRCA1, BRCA2, BRIP1, CASR, CDC73, CDH1, CDK4, CDKN1B, CDKN1C, CDKN2A, CEBPA, CHEK2, CTNNA1, DICER1, DIS3L2, EGFR, EPCAM, FH, FLCN, GATA2, GPC3, GREM1, HOXB13, HRAS, KIT, MAX, MEN1, MET, MITF, MLH1, MSH2, MSH3, MSH6, MUTYH, NBN, NF1, NF2, NTHL1, PALB2, PDGFRA, PHOX2B, PMS2, POLD1, POLE, POT1, PRKAR1A, PTCH1, PTEN, RAD50, RAD51C, RAD51D, RB1, RECQL4, RET, RUNX1, SDHA, SDHAF2, SDHB, SDHC, SDHD, SMAD4, SMARCA4, SMARCB1, SMARCE1, STK11, SUFU, TERC, TERT, TMEM127, TP53, TSC1, TSC2, VHL, WRN, WT1).  Since the current test is not perfect, it is possible that there may be a gene mutation that current testing cannot detect, but that chance is small. It is possible that a different genetic factor, which has not yet been discovered or is not on this panel, is responsible for the cancer diagnoses in the family. Again, the likelihood of this is low. Finally, it may be that there is a detectable mutation in her family that she did not inherit. No additional testing is recommended at this time  for Jade Farrell.  A Variant of Uncertain Significance was detected: BLM c.4090T>C (p.[id number]). This is still considered a normal result. While at this time, it is unknown if this finding is associated with increased cancer risk, the majority of these variants get reclassified to be inconsequential. We emphasized that medical management should not be based on this finding. With time, we suspect the lab will determine the significance, if any. If we do learn more about it, we will try to contact Jade Farrell to discuss it further. It is important to stay in touch with [place] periodically and keep the address and phone number up to date.  Cancer Screening: These results are reassuring and indicate that Jade Farrell does not likely have an increased risk of cancer due to a mutation in one of these genes. We discussed undergoing cancer screenings for individuals in the general population, but that may be modified given that 2 of her 6 sisters had breast cancer. The [employer]  recommends that women follow the breast screening recommendations below, but that these may need to be modified based on other risk factors such as dense breasts, biopsy history or family history.  Breast awareness - Women should be familiar with their breasts and promptly report changes to their healthcare provider.   Starting at age 8: Breast exam, risk assessment, and risk reduction counseling by the provider and mammogram every year. The provider may discuss screening with tomosynthesis.  Jade Farrell is also recommended to undergo a yearly gynecologic exam and speak with her  GI doctor as to when to have her next colonoscopy.  Family Members:  Any relative who had cancer at a young age or had a particularly rare cancer may also wish to pursue genetic testing. Genetic counselors can be located in other cities, by visiting the website of the [employer] (Artist[employer].se) and Field seismologist for a  Dietitian by zip code.   Family members are not recommended to get tested for the above VUS outside of a research protocol as this finding has no implications for their medical management.     Lastly, cancer genetics is a rapidly advancing field and it is possible that new genetic tests will be appropriate for Jade Farrell in the future. We encourage her to remain in contact with [place] on an annual basis so we can update her personal and family histories, and let her know of advances in cancer genetics that may benefit the family. Our contact number was provided. Jade Farrell is welcome to call anytime with additional questions.     Steele Berg, MS, [place] Certified Genetic Counselor phone: [phone]

## 2017-11-09 ENCOUNTER — Telehealth: Payer: Self-pay

## 2017-11-09 NOTE — Telephone Encounter (Signed)
SENT REFERRAL TO SCHEDULING 

## 2017-11-11 ENCOUNTER — Other Ambulatory Visit: Payer: Self-pay | Admitting: Family Medicine

## 2017-11-11 DIAGNOSIS — M7989 Other specified soft tissue disorders: Secondary | ICD-10-CM

## 2017-11-18 ENCOUNTER — Ambulatory Visit
Admission: RE | Admit: 2017-11-18 | Discharge: 2017-11-18 | Disposition: A | Discharge status: Home / Self Care | Payer: Managed Care, Other (non HMO) | Source: Ambulatory Visit | Attending: Family Medicine | Admitting: Family Medicine

## 2017-11-18 DIAGNOSIS — M7989 Other specified soft tissue disorders: Secondary | ICD-10-CM

## 2017-12-12 ENCOUNTER — Encounter: Payer: Self-pay | Admitting: Physician Assistant

## 2017-12-12 NOTE — Progress Notes (Signed)
Cardiology Office Note    Date:  12/13/2017  ID:  MARCILE FUQUAY, DOB 08-12-1966, MRN [id number] PCP:  Laurann Montana, MD  Cardiologist:  New, reviewed with Dr. Ladona Ridgel (previously followed by Dr. Delton See and will follow up with her)   Chief Complaint: intermittent chest pain  History of Present Illness:  Jade Farrell is a 52 y.o. female with history of abnormal stress test but normal cath 2015, rheumatoid arthritis, obesity, anemia, migraine, vitamin D deficiency who is being seen today for the evaluation of chest pain at the request of Dr. Laurann Montana.  She was remotely seen for chest pain in 2015 with baseline abnormal EKG. 2D echo 2015 was normal, EF 60-65%. Exercise test was abnormal -"IMPRESSION: 1. Reducemal.d overal exercise tolerance. 2. Limiting Angina (throat pain with dyspnea) 3. Ischemic ST Depressions in Lateral & Inferior Leads. ** Duke TM Score: - 14.5; HIGH RISK. Angiography Usually Indicated. Clinical correlation warranted." She underwent cardiac cath 12/2013 which was completely normal. She has not followed up since that time as symptoms completely resolved. Last labs include normal TSH 2017, normal CBC/BMET 2015. No lipids on file.  She returns for evaluation of chest pain. 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 She does not formally exercise. Over the past 4 months she's had 4 different episodes of chest pain. All of them occurred at rest. 2 of them were 1 minute long while lying in bed, with radiation to shoulder blade. One lasted several minutes described as a crushing sensation in her chest. It resolved spontaneously. 3 weeks ago she began having central dull chest pain at work that essentially lasted for approximately 8 hours, unrelenting, without relief. She tried aspirin and ibuprofen without relief. She eventually saw her PCP around 3:30pm but by then the chest pain had  eased off spontaneously. It was not worse with inspiration, palpation, movement or exertion. No nausea, diaphoresis, palpitations or syncope. She feels well today.   Past Medical History:  Diagnosis Date  . Allergy   . Anemia   . Family history of breast cancer   . Genetic testing 09/16/2017   Multi-Cancer panel (83 genes) @ [facility] - No pathogenic mutations detected  . Migraine without aura   . Normal coronary arteries    a.in 2015 (done for abnormal ETT with ST depression).  . Obesity   . Rheumatoid arteritis   . Vitamin D deficiency     Past Surgical History:  Procedure Laterality Date  . HEMORRHOID SURGERY  1994   Removed  . LEFT HEART CATHETERIZATION WITH CORONARY ANGIOGRAM N/A 12/28/2013   Procedure: LEFT HEART CATHETERIZATION WITH CORONARY ANGIOGRAM;  Surgeon: Peter M Swaziland, MD;  Location: [facility] CATH LAB;  Service: Cardiovascular;  Laterality: N/A;    Current Medications: Current Meds  Medication Sig  . Calcium Carbonate (CALCIUM 600 PO) Take by mouth.  . Cholecalciferol (VITAMIN D-3) 5000 UNITS TABS Take 5,000 Units by mouth daily.  . hydroxychloroquine (PLAQUENIL) 200 MG tablet Take 200 mg by mouth 2 (two) times daily.   . IRON PO Take by mouth daily.  Marland Kitchen levonorgestrel (MIRENA, 52 MG,) 20 MCG/24HR IUD as directed  . moxifloxacin (VIGAMOX) 0.5 % ophthalmic solution Place 1 drop into both eyes 3 (three) times daily.   . Multiple Vitamins-Minerals (MULTIVITAMIN PO) Take 1 tablet by mouth daily.   Marland Kitchen VITAMIN E PO Take by mouth daily.  Allergies:   Fluzone [flu virus vaccine]   Social History   Socioeconomic History  . Marital status: Married    Spouse name: None  . Number of children: None  . Years of education: None  . Highest education level: None  Social Needs  . Financial resource strain: None  . Food insecurity - worry: None  . Food insecurity - inability: None  . Transportation needs - medical: None  . Transportation needs - non-medical: None    Occupational History  . Occupation: TEACHER  Tobacco Use  . Smoking status: Never Smoker  . Smokeless tobacco: Never Used  Substance and Sexual Activity  . Alcohol use: No  . Drug use: No  . Sexual activity: No    Birth control/protection: None  Other Topics Concern  . None  Social History Narrative   Lives with husband.     Family History:  Family History  Problem Relation Age of Onset  . Diabetes Mother   . Hypertension Father   . Heart disease Father   . Heart disease Paternal Uncle   . Breast cancer Sister [id number]       currenlty [id number]  . Hypertension Sister   . Thyroid disease Sister        Goiter  . Hypertension Brother   . Rheum arthritis Brother   . Breast cancer Sister [id number]       currently [id number]  . Diabetes Sister   . Cancer Paternal Aunt        "abdominal ca"; deceased [id number]   ROS:   Please see the history of present illness. Otherwise, review of systems is positive for h/o leg swelling remotely, now resolved. No bleeding, weight loss.  All other systems are reviewed and otherwise negative.    PHYSICAL EXAM:   VS:  BP 112/70 (BP Location: Left Arm, Patient Position: Sitting, Cuff Size: Normal)   Pulse 75   Ht 5\' 1"  (1.549 m)   Wt 192 lb 6.4 oz (87.3 kg)   SpO2 97%   BMI 36.35 kg/m   BMI: Body mass index is 36.35 kg/m. Tech initially entered pulse of 85 in wrong space but meant to write 97. GEN: Well nourished, well developed obese F in no acute distress  HEENT: normocephalic, atraumatic Neck: no JVD, carotid bruits, or masses Cardiac: RRR; no murmurs, rubs, or gallops, no edema  Respiratory:  clear to auscultation bilaterally, normal work of breathing GI: soft, nontender, nondistended, + BS MS: no deformity or atrophy  Skin: warm and dry, no rash Neuro:  Alert and Oriented x 3, Strength and sensation are intact, follows commands Psych: euthymic mood, full affect  Wt Readings from Last 3 Encounters:  12/13/17 192 lb 6.4 oz (87.3 kg)  08/03/17 186 lb (84.4 kg)   07/13/16 181 lb 9.6 oz (82.4 kg)      Studies/Labs Reviewed:   EKG:  EKG was ordered today and personally reviewed by me and demonstrates NSR 75bpm, nonspecific inferior ST changes with subtle ST depression  Recent Labs: No results found for requested labs within last 8760 hours.   Lipid Panel No results found for: CHOL, TRIG, HDL, CHOLHDL, VLDL, LDLCALC, LDLDIRECT  Additional studies/ records that were reviewed today include: Summarized above.    ASSESSMENT & PLAN:   This patient's case was discussed in depth with Dr. Ladona Ridgelaylor. The plan below was formulated per our discussion.  1. Chest pain, mostly atypical features - normal coronaries in 2015 following false positive stress test. Differential includes GERD,  gastritis, esophageal spasm, and possibly coronary vasospasm. If this were microvascular disease, I would expect symptoms with exertion, which she has not had. She is not tachycardic, tachypneic or hypoxic. She has not had any more pain the last 3 weeks. Per discussion with Dr. Ladona Ridgel, will initiate a trial of omeprazole 20mg  daily. If pain recurs despite this, would consider empiric anti-anginal such as amlodipine at low dose (I.e. 2.5mg ) since BP is low-normal. Would avoid Imdur to start given her hx of migraines. Warning sx discussed. Will update labs to exclude any electrolyte or thyroid abnormality/anemia contributing. 2. Abnormal stress test - as above. This test is unlikely to be helpful in the future. 3. Obesity - she understands the need to be more active, and acknowledges gradual weight gain. 4. Migraines - fortunately has not had any issues with this recently. As above, avoid Imdur as a first line agent.  Disposition: F/u with Dr. Beaulah Corin team APP in 4 weeks. I told her if the omeprazole completely eliminates symptoms, she can push follow-up to 6 months.   Medication Adjustments/Labs and Tests Ordered: Current medicines are reviewed at length with the patient  today.  Concerns regarding medicines are outlined above. Medication changes, Labs and Tests ordered today are summarized above and listed in the Patient Instructions accessible in Encounters.   Signed, Laurann Montana, PA-C  12/13/2017 3:21 PM    [facility] [address] [place], [place], [place]  [address] Phone: [phone]; Fax: [phone]

## 2017-12-13 ENCOUNTER — Ambulatory Visit: Payer: Managed Care, Other (non HMO) | Admitting: Physician Assistant

## 2017-12-13 ENCOUNTER — Encounter (INDEPENDENT_AMBULATORY_CARE_PROVIDER_SITE_OTHER): Payer: Self-pay

## 2017-12-13 ENCOUNTER — Encounter: Payer: Self-pay | Admitting: Physician Assistant

## 2017-12-13 VITALS — BP 112/70 | HR 75 | Ht 61.0 in | Wt 192.4 lb

## 2017-12-13 DIAGNOSIS — Z8669 Personal history of other diseases of the nervous system and sense organs: Secondary | ICD-10-CM

## 2017-12-13 DIAGNOSIS — R9439 Abnormal result of other cardiovascular function study: Secondary | ICD-10-CM | POA: Diagnosis not present

## 2017-12-13 DIAGNOSIS — E6609 Other obesity due to excess calories: Secondary | ICD-10-CM | POA: Diagnosis not present

## 2017-12-13 DIAGNOSIS — R079 Chest pain, unspecified: Secondary | ICD-10-CM

## 2017-12-13 MED ORDER — OMEPRAZOLE 20 MG PO CPDR: 20.0000 mg | DELAYED_RELEASE_CAPSULE | Freq: Every day | ORAL | 1 refills | Status: DC

## 2017-12-13 NOTE — Patient Instructions (Addendum)
Medication Instructions:  Your physician has recommended you make the following change in your medication:  1. START OMEPRAZOLE 20 MG 1 TABLET DAILY   Labwork: TODAY: BMET, MAG, CBC & TSH  Testing/Procedures: None Ordered   Follow-Up: Follow up in 4 weeks with Dr. Delton SeeNelson or care team APP on a day Dr.Nelson is in the office.  Any Other Special Instructions Will Be Listed Below (If Applicable).     If you need a refill on your cardiac medications before your next appointment, please call your pharmacy.

## 2017-12-14 ENCOUNTER — Telehealth: Payer: Self-pay | Admitting: Physician Assistant

## 2017-12-14 LAB — BASIC METABOLIC PANEL
BUN/Creatinine Ratio: 16 (ref 9–23)
BUN: 14 mg/dL (ref 6–24)
CALCIUM: 9.9 mg/dL (ref 8.7–10.2)
CHLORIDE: 103 mmol/L (ref 96–106)
CO2: 24 mmol/L (ref 20–29)
Creatinine, Ser: 0.85 mg/dL (ref 0.57–1.00)
GFR calc Af Amer: 92 mL/min/{1.73_m2} (ref >59)
GFR calc non Af Amer: 80 mL/min/{1.73_m2} (ref >59)
Glucose: 88 mg/dL (ref 65–99)
POTASSIUM: 4.3 mmol/L (ref 3.5–5.2)
SODIUM: 144 mmol/L (ref 134–144)

## 2017-12-14 LAB — MAGNESIUM: MAGNESIUM: 1.9 mg/dL (ref 1.6–2.3)

## 2017-12-14 LAB — CBC
Hematocrit: 38.2 % (ref 34.0–46.6)
Hemoglobin: 12.5 g/dL (ref 11.1–15.9)
MCH: 25.6 pg — ABNORMAL LOW (ref 26.6–33.0)
MCHC: 32.7 g/dL (ref 31.5–35.7)
MCV: 78 fL — ABNORMAL LOW (ref 79–97)
PLATELETS: 337 10*3/uL (ref 150–379)
RBC: 4.88 x10E6/uL (ref 3.77–5.28)
RDW: 14 % (ref 12.3–15.4)
WBC: 6.1 10*3/uL (ref 3.4–10.8)

## 2017-12-14 LAB — TSH: TSH: 1.18 u[IU]/mL (ref 0.450–4.500)

## 2017-12-14 NOTE — Telephone Encounter (Signed)
Returned pts call and she has been made aware of her lab results. See result note. 

## 2017-12-14 NOTE — Telephone Encounter (Signed)
New Message:    Pt is returning missed call about her lab results

## 2017-12-14 NOTE — Telephone Encounter (Signed)
-----   Message from Laurann Montanaayna N Dunn, [place]-C sent at 12/14/2017  8:35 AM EDT ----- Please let patient know all labs look good except minimally decreased MCV count. This is the size of the red blood cells. Since her Hgb is normal this is no cause for acute concern but can sometimes indicate an iron deficiency so she will want to have this monitored by primary care. Dayna Dunn PA-C

## 2018-01-16 ENCOUNTER — Ambulatory Visit: Payer: Managed Care, Other (non HMO) | Admitting: Cardiology

## 2018-02-26 ENCOUNTER — Other Ambulatory Visit: Payer: Self-pay | Admitting: Physician Assistant

## 2018-09-11 ENCOUNTER — Other Ambulatory Visit: Payer: Self-pay | Admitting: Family Medicine

## 2018-09-11 DIAGNOSIS — Z1231 Encounter for screening mammogram for malignant neoplasm of breast: Secondary | ICD-10-CM

## 2018-09-18 ENCOUNTER — Ambulatory Visit
Admission: RE | Admit: 2018-09-18 | Discharge: 2018-09-18 | Disposition: A | Discharge status: Home / Self Care | Payer: Managed Care, Other (non HMO) | Source: Ambulatory Visit | Attending: Family Medicine | Admitting: Family Medicine

## 2018-09-18 DIAGNOSIS — Z1231 Encounter for screening mammogram for malignant neoplasm of breast: Secondary | ICD-10-CM

## 2018-10-03 ENCOUNTER — Encounter: Payer: Self-pay | Admitting: Obstetrics and Gynecology

## 2018-10-03 ENCOUNTER — Encounter: Payer: Managed Care, Other (non HMO) | Admitting: Obstetrics and Gynecology

## 2018-10-03 DIAGNOSIS — R1084 Generalized abdominal pain: Secondary | ICD-10-CM

## 2018-10-04 NOTE — Progress Notes (Signed)
Patient did not keep her GYN referral appointment for 10/03/2018.  Cornelia Copa MD Attending Center for [employer])

## 2018-10-23 ENCOUNTER — Other Ambulatory Visit: Payer: Self-pay | Admitting: Orthopedic Surgery

## 2018-10-23 DIAGNOSIS — IMO0002 Reserved for concepts with insufficient information to code with codable children: Secondary | ICD-10-CM

## 2018-10-23 DIAGNOSIS — R229 Localized swelling, mass and lump, unspecified: Principal | ICD-10-CM

## 2019-07-24 NOTE — Progress Notes (Signed)
Virtual Visit via Video Note   This visit type was conducted due to national recommendations for restrictions regarding the COVID-19 Pandemic (e.g. social distancing) in an effort to limit this patient's exposure and mitigate transmission in our community.  Due to her co-morbid illnesses, this patient is at least at moderate risk for complications without adequate follow up.  This format is felt to be most appropriate for this patient at this time.  All issues noted in this document were discussed and addressed.  A limited physical exam was performed with this format.  Please refer to the patient's chart for her consent to telehealth for [facility].   Date:  07/25/2019   ID:  Jade Farrell, Jade Farrell 15-Dec-1965, MRN [id number]  Patient Location: Home Provider Location: Home  PCP:  Laurann Montana, MD  Cardiologist:  Tobias Alexander, MD  Electrophysiologist:  None   Evaluation Performed:  Follow-Up Visit  Chief Complaint:  Irregular heartbeat  History of Present Illness:    Jade Farrell is a 53 y.o. female with  history of abnormal stress test but normal cath 2015, 2D echo 2015 normal LVEF 60 to 65% rheumatoid arthritis, obesity, anemia   Last seen in our office 11/2017 by Ronie Spies, PA-C for chest pain that was felt to be atypical and was started on omeprazole 20 mg daily.  Patient complains of skipping heart beat followed by a "gushing" sensation. Can happen off/on all day long. No chest pain, shortness of breath, dizziness or presyncope. Drinks 10-12 tea bags/day. No regular exercise. 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 Also has a pain in her back into her chest squeezing sensation that has happened 5 or 6 times in the past several months. Only lasts 1 min and occurs at rest. No GERD symptoms or arthritis in back/shoulders.    The patient does not have symptoms concerning for COVID-19 infection (fever, chills, cough, or new  shortness of breath).    Past Medical History:  Diagnosis Date  . Allergy   . Anemia   . Family history of breast cancer   . Genetic testing 09/16/2017   Multi-Cancer panel (83 genes) @ [facility] - No pathogenic mutations detected  . Migraine without aura   . Normal coronary arteries    a.in 2015 (done for abnormal ETT with ST depression).  . Obesity   . Rheumatoid arteritis (HCC)   . Vitamin D deficiency    Past Surgical History:  Procedure Laterality Date  . HEMORRHOID SURGERY  1994   Removed  . LEFT HEART CATHETERIZATION WITH CORONARY ANGIOGRAM N/A 12/28/2013   Procedure: LEFT HEART CATHETERIZATION WITH CORONARY ANGIOGRAM;  Surgeon: Peter M Swaziland, MD;  Location[facility] CATH LAB;  Service: Cardiovascular;  Laterality: N/A;     Current Meds  Medication Sig  . Biotin 5000 MCG TABS Take 1 tablet by mouth daily.  . Calcium Carbonate (CALCIUM 600 PO) Take by mouth.  . Cholecalciferol (VITAMIN D-3) 5000 UNITS TABS Take 5,000 Units by mouth daily.  . folic acid (FOLVITE) 800 MCG tablet Take 800 mcg by mouth daily.  . hydroxychloroquine (PLAQUENIL) 200 MG tablet Take 200 mg by mouth 2 (two) times daily.   . IRON PO Take by mouth daily.  Marland Kitchen levonorgestrel (MIRENA, 52 MG,) 20 MCG/24HR IUD as directed  . Multiple Vitamins-Minerals (MULTIVITAMIN PO) Take 1 tablet by mouth daily.   Marland Kitchen omeprazole (PRILOSEC) 20 MG capsule TAKE 1 CAPSULE BY MOUTH EVERY DAY  . tobramycin-dexamethasone (TOBRADEX) ophthalmic  solution Place 1 drop into both eyes as needed for dry eyes.  Marland Kitchen. VITAMIN E PO Take by mouth daily.     Allergies:   Fluzone [flu virus vaccine]   Social History   Tobacco Use  . Smoking status: Never Smoker  . Smokeless tobacco: Never Used  Substance Use Topics  . Alcohol use: No  . Drug use: No     Family Hx: The patient's family history includes Breast cancer (age of onset: 7241) in her sister; Breast cancer (age of onset: 1553) in her sister; Cancer in her paternal aunt; Diabetes in her  mother and sister; Heart disease in her father and paternal uncle; Hypertension in her brother, father, and sister; Rheum arthritis in her brother; Thyroid disease in her sister.  ROS:   Please see the history of present illness.      All other systems reviewed and are negative.   Prior CV studies:   The following studies were reviewed today:  Cardiac catheterization 2015Coronary angiography: Coronary dominance: right   Left mainstem: Normal   Left anterior descending (LAD): Normal   Left circumflex (LCx): Normal   Right coronary artery (RCA): Normal   Left ventriculography: Left ventricular systolic function is normal, LVEF is estimated at 55-65%, there is no significant mitral regurgitation    Final Conclusions:   1. Normal coronary anatomy 2. Normal LV function   Recommendations: Medical management.   Theron Aristaeter [facility] Center LLCJordanMD,FACC 12/28/2013, 11:45 AM     Labs/Other Tests and Data Reviewed:    EKG:  An ECG dated 12/13/17 was personally reviewed today and demonstrated:  NSR  Recent Labs: No results found for requested labs within last 8760 hours.   Recent Lipid Panel No results found for: CHOL, TRIG, HDL, CHOLHDL, LDLCALC, LDLDIRECT  Wt Readings from Last 3 Encounters:  07/25/19 190 lb (86.2 kg)  12/13/17 192 lb 6.4 oz (87.3 kg)  08/03/17 186 lb (84.4 kg)     Objective:    Vital Signs:  Ht 5\' 2"  (1.575 m)   Wt 190 lb (86.2 kg)   BMI 34.75 kg/m    VITAL SIGNS:  reviewed GEN:  no acute distress RESPIRATORY:  normal respiratory effort, symmetric expansion CARDIOVASCULAR:  no peripheral edema  ASSESSMENT & PLAN:    Palpitations complains of irregular heartbeat off/on all day long. Drinking 10-12 cups of tea daily. Check thyroid, electrolytes and 30 day monitor. Limit tea to 2 cups daily.  History of chest pain with abnormal stress test in 2015 but normal cardiac catheterization 2015. Having occasional atypical chest pain. Will re-evaluate at next visit to see if  it's ongoing and if we need to consider further evaluation such as coronary CTA  Obesity Exercise and weight loss recommended.  COVID-19 Education: The signs and symptoms of COVID-19 were discussed with the patient and how to seek care for testing (follow up with PCP or arrange E-visit).  The importance of social distancing was discussed today.  Time:   Today, I have spent 19:47 minutes with the patient with telehealth technology discussing the above problems.     Medication Adjustments/Labs and Tests Ordered: Current medicines are reviewed at length with the patient today.  Concerns regarding medicines are outlined above.   Tests Ordered: No orders of the defined types were placed in this encounter.   Medication Changes: No orders of the defined types were placed in this encounter.   Follow Up:  Either In Person or Virtual in 6 week(s) with Jacolyn ReedyMichele Mizuki Hoel PA-C  Signed,  Ermalinda Barrios, PA-C  07/25/2019 9:02 AM    [facility]

## 2019-07-25 ENCOUNTER — Telehealth (INDEPENDENT_AMBULATORY_CARE_PROVIDER_SITE_OTHER): Payer: Managed Care, Other (non HMO) | Admitting: Physician Assistant

## 2019-07-25 ENCOUNTER — Other Ambulatory Visit: Payer: Self-pay

## 2019-07-25 ENCOUNTER — Encounter: Payer: Self-pay | Admitting: Physician Assistant

## 2019-07-25 VITALS — Ht 62.0 in | Wt 190.0 lb

## 2019-07-25 DIAGNOSIS — Z87898 Personal history of other specified conditions: Secondary | ICD-10-CM

## 2019-07-25 DIAGNOSIS — R002 Palpitations: Secondary | ICD-10-CM | POA: Diagnosis not present

## 2019-07-25 NOTE — Patient Instructions (Addendum)
Medication Instructions:  Your physician recommends that you continue on your current medications as directed. Please refer to the Current Medication list given to you today.  If you need a refill on your cardiac medications before your next appointment, please call your pharmacy.   Lab work: Your physician recommends that you return for lab work (CMET, CBC, TSH) on 08/01/19   If you have labs (blood work) drawn today and your tests are completely normal, you will receive your results only by: Marland Kitchen MyChart Message (if you have MyChart) OR . A paper copy in the mail If you have any lab test that is abnormal or we need to change your treatment, we will call you to review the results.  Testing/Procedures: Your physician has recommended that you wear an event monitor. Event monitors are medical devices that record the heart's electrical activity. Doctors most often [place] these monitors to diagnose arrhythmias. Arrhythmias are problems with the speed or rhythm of the heartbeat. The monitor is a small, portable device. You can wear one while you do your normal daily activities. This is usually used to diagnose what is causing palpitations/syncope (passing out).  Follow-Up: . Follow up with Ermalinda Barrios, PA via VIRTUAL Visit on 09/12/19 at 10:00 AM  Any Other Special Instructions Will Be Listed Below (If Applicable).  Limit your caffeine to 2 cups per day

## 2019-08-01 ENCOUNTER — Other Ambulatory Visit: Payer: Managed Care, Other (non HMO)

## 2019-08-03 ENCOUNTER — Other Ambulatory Visit: Payer: Self-pay

## 2019-08-03 ENCOUNTER — Other Ambulatory Visit: Payer: Managed Care, Other (non HMO) | Admitting: *Deleted

## 2019-08-03 DIAGNOSIS — R002 Palpitations: Secondary | ICD-10-CM

## 2019-08-03 LAB — COMPREHENSIVE METABOLIC PANEL
ALT: 21 IU/L (ref 0–32)
AST: 19 IU/L (ref 0–40)
Albumin/Globulin Ratio: 1.6 (ref 1.2–2.2)
Albumin: 4.4 g/dL (ref 3.8–4.9)
Alkaline Phosphatase: 118 IU/L — ABNORMAL HIGH (ref 39–117)
BUN/Creatinine Ratio: 25 — ABNORMAL HIGH (ref 9–23)
BUN: 19 mg/dL (ref 6–24)
Bilirubin Total: <0.2 mg/dL (ref 0.0–1.2)
CO2: 25 mmol/L (ref 20–29)
Calcium: 10 mg/dL (ref 8.7–10.2)
Chloride: 100 mmol/L (ref 96–106)
Creatinine, Ser: 0.76 mg/dL (ref 0.57–1.00)
GFR calc Af Amer: 104 mL/min/{1.73_m2} (ref >59)
GFR calc non Af Amer: 90 mL/min/{1.73_m2} (ref >59)
Globulin, Total: 2.7 g/dL (ref 1.5–4.5)
Glucose: 98 mg/dL (ref 65–99)
Potassium: 4.5 mmol/L (ref 3.5–5.2)
Sodium: 141 mmol/L (ref 134–144)
Total Protein: 7.1 g/dL (ref 6.0–8.5)

## 2019-08-03 LAB — CBC
Hematocrit: 39.5 % (ref 34.0–46.6)
Hemoglobin: 13.3 g/dL (ref 11.1–15.9)
MCH: 25.9 pg — ABNORMAL LOW (ref 26.6–33.0)
MCHC: 33.7 g/dL (ref 31.5–35.7)
MCV: 77 fL — ABNORMAL LOW (ref 79–97)
Platelets: 356 10*3/uL (ref 150–450)
RBC: 5.14 x10E6/uL (ref 3.77–5.28)
RDW: 12.5 % (ref 11.7–15.4)
WBC: 5.1 10*3/uL (ref 3.4–10.8)

## 2019-08-03 LAB — TSH: TSH: 1.49 u[IU]/mL (ref 0.450–4.500)

## 2019-08-06 ENCOUNTER — Telehealth: Payer: Self-pay

## 2019-08-06 NOTE — Telephone Encounter (Signed)
LM with monitor instructions. 30 day [employer] Event monitor ordered.  

## 2019-08-16 ENCOUNTER — Ambulatory Visit (INDEPENDENT_AMBULATORY_CARE_PROVIDER_SITE_OTHER): Payer: Managed Care, Other (non HMO)

## 2019-08-16 ENCOUNTER — Encounter: Payer: Self-pay | Admitting: Cardiology

## 2019-08-16 DIAGNOSIS — R002 Palpitations: Secondary | ICD-10-CM

## 2019-08-29 ENCOUNTER — Other Ambulatory Visit: Payer: Managed Care, Other (non HMO)

## 2019-09-10 ENCOUNTER — Other Ambulatory Visit: Payer: Self-pay | Admitting: Family Medicine

## 2019-09-10 DIAGNOSIS — Z1231 Encounter for screening mammogram for malignant neoplasm of breast: Secondary | ICD-10-CM

## 2019-09-11 DIAGNOSIS — R002 Palpitations: Secondary | ICD-10-CM | POA: Insufficient documentation

## 2019-09-11 NOTE — Progress Notes (Signed)
Virtual Visit via Video Note   This visit type was conducted due to national recommendations for restrictions regarding the COVID-19 Pandemic (e.g. social distancing) in an effort to limit this patient's exposure and mitigate transmission in our community.  Due to her co-morbid illnesses, this patient is at least at moderate risk for complications without adequate follow up.  This format is felt to be most appropriate for this patient at this time.  All issues noted in this document were discussed and addressed.  A limited physical exam was performed with this format.  Please refer to the patient's chart for her consent to telehealth for Jade Farrell.   Date:  09/12/2019   ID:  Jade Farrell, DOB May 12, 1966, MRN [id number]  Patient Location: Home Provider Location: Home  PCP:  Jade Stains, MD  Cardiologist:  Jade Dawley, MD   Electrophysiologist:  None   Evaluation Performed:  Follow-Up Visit  Chief Complaint:  f/u  History of Present Illness:    Jade Farrell is a 54 y.o. female with  history of abnormal stress test but normal cath 2015, 2D echo 2015 normal LVEF 60 to 65% rheumatoid arthritis, obesity, anemia.  I saw the patient 07/25/2019 with palpitations.  She was drinking 10-12 tea bags daily.   I had her cut back on caffeine, labs were completely normal including thyroid, CBC and C met.  30-day monitor is still being worn but some preliminary showed sinus tachycardia with PVCs heart rate of 106 otherwise normal sinus rhythm between 68 and 90s  Patient says she's had a little bit of fluttering but not as bad. Worse when she wakes up from sleep apnea. Has not cut back on caffeine yet as she wanted the monitor to pick it up. No chest pain which was previously sharp and gushing sensation. .   The patient does not have symptoms concerning for COVID-19 infection (fever, chills, cough, or new shortness of breath).    Past Medical History:  Diagnosis  Date  . Allergy   . Anemia   . Family history of breast cancer   . Genetic testing 09/16/2017   Multi-Cancer panel (83 genes) @ [facility] - No pathogenic mutations detected  . Migraine without aura   . Normal coronary arteries    a.in 2015 (done for abnormal ETT with ST depression).  . Obesity   . Rheumatoid arteritis ([place])   . Vitamin D deficiency    Past Surgical History:  Procedure Laterality Date  . [place]   Removed  . LEFT HEART CATHETERIZATION WITH CORONARY ANGIOGRAM N/A 12/28/2013   Procedure: LEFT HEART CATHETERIZATION WITH CORONARY ANGIOGRAM;  Surgeon: Jade M Martinique, MD;  Location: [facility] CATH LAB;  Service: Cardiovascular;  Laterality: N/A;     Current Meds  Medication Sig  . Biotin 5000 MCG TABS Take 1 tablet by mouth daily.  . Calcium Carbonate (CALCIUM 600 PO) Take by mouth.  . Cholecalciferol (VITAMIN D-3) 5000 UNITS TABS Take 5,000 Units by mouth daily.  . folic acid (FOLVITE) 973 MCG tablet Take 800 mcg by mouth daily.  . hydroxychloroquine (PLAQUENIL) 200 MG tablet Take 200 mg by mouth 2 (two) times daily.   . IRON PO Take by mouth daily.  Marland Kitchen levonorgestrel (MIRENA, 52 MG,) 20 MCG/24HR IUD as directed  . Multiple Vitamins-Minerals (MULTIVITAMIN PO) Take 1 tablet by mouth daily.   Marland Kitchen omeprazole (PRILOSEC) 20 MG capsule TAKE 1 CAPSULE BY MOUTH EVERY DAY  . tobramycin-dexamethasone (TOBRADEX) ophthalmic solution Place 1  drop into both eyes as needed for dry eyes.  Marland Kitchen VITAMIN E PO Take by mouth daily.     Allergies:   Fluzone [flu virus vaccine]   Social History   Tobacco Use  . Smoking status: Never Smoker  . Smokeless tobacco: Never Used  Substance Use Topics  . Alcohol use: No  . Drug use: No     Family Hx: The patient's family history includes Breast cancer (age of onset: 59) in her sister; Breast cancer (age of onset: 63) in her sister; Cancer in her paternal aunt; Diabetes in her mother and sister; Heart disease in her father and paternal  uncle; Hypertension in her brother, father, and sister; Rheum arthritis in her brother; Thyroid disease in her sister.  ROS:   Please see the history of present illness.      All other systems reviewed and are negative.   Prior CV studies:   The following studies were reviewed today:  Cardiac catheterization 2015 Coronary angiography: Coronary dominance: right   Left mainstem: Normal   Left anterior descending (LAD): Normal   Left circumflex (LCx): Normal   Right coronary artery (RCA): Normal   Left ventriculography: Left ventricular systolic function is normal, LVEF is estimated at 55-65%, there is no significant mitral regurgitation    Final Conclusions:   1. Normal coronary anatomy 2. Normal LV function   Recommendations: Medical management.   Jade Farrell 12/28/2013, 11:45 AM     Labs/Other Tests and Data Reviewed:    EKG:  No ECG reviewed.  Recent Labs: 08/03/2019: ALT 21; BUN 19; Creatinine, Ser 0.76; Hemoglobin 13.3; Platelets 356; Potassium 4.5; Sodium 141; TSH 1.490   Recent Lipid Panel No results found for: CHOL, TRIG, HDL, CHOLHDL, LDLCALC, LDLDIRECT  Wt Readings from Last 3 Encounters:  09/12/19 185 lb (83.9 kg)  07/25/19 190 lb (86.2 kg)  12/13/17 192 lb 6.4 oz (87.3 kg)     Objective:    Vital Signs:  Ht 5' 2"  (1.575 m)   Wt 185 lb (83.9 kg)   BMI 33.84 kg/m    VITAL SIGNS:  reviewed GEN:  no acute distress RESPIRATORY:  normal respiratory effort, symmetric expansion CARDIOVASCULAR:  no peripheral edema  ASSESSMENT & PLAN:    1. Palpitations still wearing monitor but some strips reviewed and she had sinus tachycardia up to 106 bpm with PVCs at 8:27 AM otherwise heart rates ranged between 68 and 100-Patient states she was sitting at her desk at work at that time.  Has not cut back on her caffeine at all yet.  Recommend reduce her caffeine drastically and have follow-up after the full monitor is returned in 2 days and  completed 2. History of chest pain with abnormal stress test in 2015 but normal cardiac catheterization 2015.  Was having some occasional atypical chest pain last visit but has not had since she has been wearing the monitor. 3. Obesity exercise and weight loss recommended  COVID-19 Education: The signs and symptoms of COVID-19 were discussed with the patient and how to seek care for testing (follow up with PCP or arrange E-visit).   The importance of social distancing was discussed today.  Time:   Today, I have spent 12:20  minutes with the patient with telehealth technology discussing the above problems.     Medication Adjustments/Labs and Tests Ordered: Current medicines are reviewed at length with the patient today.  Concerns regarding medicines are outlined above.   Tests Ordered: No orders of the defined types  were placed in this encounter.   Medication Changes: No orders of the defined types were placed in this encounter.   Follow Up:  Either In Person or Virtual in 3 week(s) Ermalinda Barrios, PA-C or Dr. Meda Coffee  Signed, Ermalinda Barrios, PA-C  09/12/2019 10:27 AM    [place]

## 2019-09-12 ENCOUNTER — Telehealth (INDEPENDENT_AMBULATORY_CARE_PROVIDER_SITE_OTHER): Payer: Managed Care, Other (non HMO) | Admitting: Physician Assistant

## 2019-09-12 ENCOUNTER — Other Ambulatory Visit: Payer: Self-pay

## 2019-09-12 ENCOUNTER — Encounter: Payer: Self-pay | Admitting: Physician Assistant

## 2019-09-12 VITALS — Ht 62.0 in | Wt 185.0 lb

## 2019-09-12 DIAGNOSIS — R079 Chest pain, unspecified: Secondary | ICD-10-CM

## 2019-09-12 DIAGNOSIS — R002 Palpitations: Secondary | ICD-10-CM | POA: Diagnosis not present

## 2019-09-12 NOTE — Patient Instructions (Addendum)
Medication Instructions:  Your physician recommends that you continue on your current medications as directed. Please refer to the Current Medication list given to you today.  *If you need a refill on your cardiac medications before your next appointment, please call your pharmacy*  Lab Work: None ordered  If you have labs (blood work) drawn today and your tests are completely normal, you will receive your results only by: Marland Kitchen MyChart Message (if you have MyChart) OR . A paper copy in the mail If you have any lab test that is abnormal or we need to change your treatment, we will call you to review the results.  Testing/Procedures: None ordered  Follow-Up: At [facility], you and your health needs are our priority.  As part of our continuing mission to provide you with exceptional heart care, we have created designated Provider Care Teams.  These Care Teams include your primary Cardiologist (physician) and Advanced Practice Providers (APPs -  Physician Assistants and Nurse Practitioners) who all work together to provide you with the care you need, when you need it.  Your next appointment:   We will do another virtual follow-up appointment with Ermalinda Barrios, PA-C, to discuss your monitor results on 10/02/2019 at 2:00.  The format for your next appointment:   Virtual  Provider:   You may see Ermalinda Barrios, PA-C   Other Instructions Cut back on your caffeine

## 2019-09-17 NOTE — Progress Notes (Signed)
53 y.o. [id number] Married Black or [place] Not Hispanic or Latino female here for annual exam.  She [facility] years [facility].  She has a mirena IUD that has been in for 7 years.  No vaginal bleeding. No night sweats, she isn't sure if she is having hot flashes. All of a sudden she has a sensation of needles all over her body. If she goes out in the cold it gets better. She is having this sensation 3 x a day for the last month. Prior to that it was 1 x a day. Thinks it is a hot flashes. Not sexually active.  She has some urge incontinence only at night. She is up to void 2-3 x at night. No ETOH. She has cut back on her caffeine, minimal now. She drinks a glass of water with medication, right prior to bed with medication. She voids normal amounts at night. Voids normally during the day.      No LMP recorded. (Menstrual status: IUD).          Sexually active: No.  The current method of family planning is IUD.    Exercising: No.  The patient does not participate in regular exercise at present. Smoker:  no  Health Maintenance: Pap:  08/03/2017 WNL NEG HPV History of abnormal Pap:  no MMG:  09/18/2018 Birads 1 negative, scheduled Colonoscopy:  2018 WNL per patient  BMD:   Never TDaP:  09-22-12 Gardasil: N/A   reports that she has never smoked. She has never used smokeless tobacco. She reports that she does not drink alcohol or use drugs. She is originally from [place]. 5 kids, 2 are still in HS. One kid in college, 2 are working. Everyone is home for now.  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   Past Medical History:  Diagnosis Date  . Allergy   . Anemia   . Family history of breast cancer   . Genetic testing 09/16/2017   Multi-Cancer panel (83 genes) @ [facility] - No pathogenic mutations detected  . Migraine without aura   . Normal coronary arteries    a.in 2015 (done for abnormal ETT with ST depression).  . Obesity   . Rheumatoid arteritis ([facility])   . Vitamin  D deficiency     Past Surgical History:  Procedure Laterality Date  . HEMORRHOID SURGERY  1994   Removed  . LEFT HEART CATHETERIZATION WITH CORONARY ANGIOGRAM N/A 12/28/2013   Procedure: LEFT HEART CATHETERIZATION WITH CORONARY ANGIOGRAM;  Surgeon: Peter M SwazilandJordan, MD;  [facility];  Service: Cardiovascular;  Laterality: N/A;    Current Outpatient Medications  Medication Sig Dispense Refill  . Biotin 5000 MCG TABS Take 1 tablet by mouth daily.    . Calcium Carbonate (CALCIUM 600 PO) Take by mouth.    . Cholecalciferol (VITAMIN D-3) 5000 UNITS TABS Take 5,000 Units by mouth daily.    . folic acid (FOLVITE) 800 MCG tablet Take 800 mcg by mouth daily.    . hydroxychloroquine (PLAQUENIL) 200 MG tablet Take 200 mg by mouth 2 (two) times daily.     . IRON PO Take by mouth daily.    Marland Kitchen. levonorgestrel (MIRENA, 52 MG,) 20 MCG/24HR IUD as directed    . Multiple Vitamins-Minerals (MULTIVITAMIN PO) Take 1 tablet by mouth daily.     Marland Kitchen. VITAMIN E PO Take by mouth daily.     No current facility-administered medications for this visit.    Family  History  Problem Relation Age of Onset  . Diabetes Mother   . Hypertension Father   . Heart disease Father        Blockage in his 46s  . Heart disease Paternal Uncle        Blockage in his 20s  . Breast cancer Sister 58       currenlty 11  . Hypertension Sister   . Thyroid disease Sister        Goiter  . Hypertension Brother   . Rheum arthritis Brother   . Breast cancer Sister 49       currently 67  . Diabetes Sister   . Cancer Paternal Aunt        "abdominal ca"; deceased 66    Review of Systems  Constitutional:       Hot flashes  HENT: Negative.   Eyes: Negative.   Respiratory: Negative.   Cardiovascular: Negative.   Gastrointestinal: Negative.   Endocrine: Negative.   Genitourinary: Negative.   Musculoskeletal: Negative.   Skin: Positive for color change.  Allergic/Immunologic: Negative.   Neurological: Positive for  headaches.  Hematological: Negative.   Psychiatric/Behavioral: Negative.     Exam:   BP 128/80 (BP Location: Right Arm, Patient Position: Sitting, Cuff Size: Large)   Pulse 84   Temp 97.9 F (36.6 C) (Skin)   Ht 5\' 1"  (1.549 m)   Wt 197 lb 12.8 oz (89.7 kg)   BMI 37.37 kg/m   Weight change: @WEIGHTCHANGE @ Height:   Height: 5\' 1"  (154.9 cm)  Ht Readings from Last 3 Encounters:  09/24/19 5\' 1"  (1.549 m)  09/12/19 5\' 2"  (1.575 m)  07/25/19 5\' 2"  (1.575 m)    General appearance: alert, cooperative and appears stated age Head: Normocephalic, without obvious abnormality, atraumatic Neck: no adenopathy, supple, symmetrical, trachea midline and thyroid nodule felt on the right (patient has known nodules) Lungs: clear to auscultation bilaterally Cardiovascular: regular rate and rhythm Breasts: normal appearance, no masses or tenderness Abdomen: soft, non-tender; non distended,  no masses,  no organomegaly Extremities: extremities normal, atraumatic, no cyanosis or edema Skin: Skin color, texture, turgor normal. No rashes or lesions Lymph nodes: Cervical, supraclavicular, and axillary nodes normal. No abnormal inguinal nodes palpated Neurologic: Grossly normal   Pelvic: External genitalia:  no lesions              Urethra:  normal appearing urethra with no masses, tenderness or lesions              Bartholins and Skenes: normal                 Vagina: normal appearing vagina with normal color and discharge, no lesions              Cervix: no lesions               Bimanual Exam:  Uterus:  normal size, contour, position, consistency, mobility, non-tender              Adnexa: no mass, fullness, tenderness               Rectovaginal: Confirms               Anus:  normal sphincter tone, no lesions  Kaitlyn Sprague chaperoned for the exam.  A:  Well Woman with normal exam  Thyroid nodule, f/u with primary  IUD overdue for removal  Suspect PMP, elevated FSH 2 years ago  Hot  flashes  Urge incontinence,  new in the last 6 months  P:   No pap this year  Mammogram scheduled  Colonoscopy UTD  Discussed breast self exam  Discussed calcium and vit D intake  Discussed behavioral changes for vasomotor symptoms, discussed over the counter treatments, discussed risks and benefits of HRT  Information on HRT given  IUD pulled  Provera 5 mg x 5 days, she is to take it in 2 months and call with any bleeding  Send urine for ua, c&s  Kegel information given. Discussed PT and medication as possible treatment options.

## 2019-09-24 ENCOUNTER — Ambulatory Visit: Payer: Managed Care, Other (non HMO) | Admitting: Obstetrics and Gynecology

## 2019-09-24 ENCOUNTER — Encounter: Payer: Self-pay | Admitting: Obstetrics and Gynecology

## 2019-09-24 ENCOUNTER — Other Ambulatory Visit: Payer: Self-pay

## 2019-09-24 VITALS — BP 128/80 | HR 84 | Temp 97.9°F | Ht 61.0 in | Wt 197.8 lb

## 2019-09-24 DIAGNOSIS — R232 Flushing: Secondary | ICD-10-CM | POA: Diagnosis not present

## 2019-09-24 DIAGNOSIS — Z30432 Encounter for removal of intrauterine contraceptive device: Secondary | ICD-10-CM

## 2019-09-24 DIAGNOSIS — N3941 Urge incontinence: Secondary | ICD-10-CM

## 2019-09-24 DIAGNOSIS — Z01419 Encounter for gynecological examination (general) (routine) without abnormal findings: Secondary | ICD-10-CM

## 2019-09-24 MED ORDER — MEDROXYPROGESTERONE ACETATE 5 MG PO TABS: ORAL_TABLET | ORAL | 0 refills | Status: DC

## 2019-09-24 NOTE — Patient Instructions (Addendum)
You can try Estroven for hot flashes  In early March take provera 5 mg x 5 days. If you have any bleeding please call the office.    Menopause and Hormone Replacement Therapy Menopause is a normal time of life when menstrual periods stop completely and the ovaries stop producing the female hormones estrogen and progesterone. This lack of hormones can affect your health and cause undesirable symptoms. Hormone replacement therapy (HRT) can relieve some of those symptoms. What is hormone replacement therapy? HRT is the use of artificial (synthetic) hormones to replace hormones that your body has stopped producing because you have reached menopause. What are my options for HRT?  HRT may consist of the synthetic hormones estrogen and progestin, or it may consist of only estrogen (estrogen-only therapy). You and your health care provider will decide which form of HRT is best for you. If you choose to be on HRT and you have a uterus, estrogen and progestin are usually prescribed. Estrogen-only therapy is used for women who do not have a uterus. Possible options for taking HRT include:  Pills.  Patches.  Gels.  Sprays.  Vaginal cream.  Vaginal rings.  Vaginal inserts. The amount of hormone(s) that you take and how long you take the hormone(s) varies according to your health. It is important to:  Begin HRT with the lowest possible dosage.  Stop HRT as soon as your health care provider tells you to stop.  Work with your health care provider so that you feel informed and comfortable with your decisions. What are the benefits of HRT? HRT can reduce the frequency and severity of menopausal symptoms. Benefits of HRT vary according to the kind of symptoms that you have, how severe they are, and your overall health. HRT may help to improve the following symptoms of menopause:  Hot flashes and night sweats. These are sudden feelings of heat that spread over the face and body. The skin may turn  red, like a blush. Night sweats are hot flashes that happen while you are sleeping or trying to sleep.  Bone loss (osteoporosis). The body loses calcium more quickly after menopause, causing the bones to become weaker. This can increase the risk for bone breaks (fractures).  Vaginal dryness. The lining of the vagina can become thin and dry, which can cause pain during sex or cause infection, burning, or itching.  Urinary tract infections.  Urinary incontinence. This is the inability to control when you pass urine.  Irritability.  Short-term memory problems. What are the risks of HRT? Risks of HRT vary depending on your individual health and medical history. Risks of HRT also depend on whether you receive both estrogen and progestin or you receive estrogen only. HRT may increase the risk of:  Spotting. This is when a small amount of blood leaks from the vagina unexpectedly.  Endometrial cancer. This cancer is in the lining of the uterus (endometrium).  Breast cancer.  Increased density of breast tissue. This can make it harder to find breast cancer on a breast X-ray (mammogram).  Stroke.  Heart disease.  Blood clots.  Gallbladder disease.  Liver disease. Risks of HRT can increase if you have any of the following conditions:  Endometrial cancer.  Liver disease.  Heart disease.  Breast cancer.  History of blood clots.  History of stroke. Follow these instructions at home:  Take over-the-counter and prescription medicines only as told by your health care provider.  Get mammograms, pelvic exams, and medical checkups as often as  told by your health care provider.  Have Pap tests done as often as told by your health care provider. A Pap test is sometimes called a Pap smear. It is a screening test that is used to check for signs of cancer of the cervix and vagina. A Pap test can also identify the presence of infection or precancerous changes. Pap tests may be  done: ? Every 3 years, starting at age 41. ? Every 5 years, starting after age 4, in combination with testing for human papillomavirus (HPV). ? More often or less often depending on other medical conditions you have, your age, and other risk factors.  It is up to you to get the results of your Pap test. Ask your health care provider, or the department that is doing the test, when your results will be ready.  Keep all follow-up visits as told by your health care provider. This is important. Contact a health care provider if you have:  Pain or swelling in your legs.  Shortness of breath.  Chest pain.  Lumps or changes in your breasts or armpits.  Slurred speech.  Pain, burning, or bleeding when you urinate.  Unusual vaginal bleeding.  Dizziness or headaches.  Weakness or numbness in any part of your arms or legs.  Pain in your abdomen. Summary  Menopause is a normal time of life when menstrual periods stop completely and the ovaries stop producing the female hormones estrogen and progesterone.  Hormone replacement therapy (HRT) can relieve some of the symptoms of menopause.  HRT can reduce the frequency and severity of menopausal symptoms.  Risks of HRT vary depending on your individual health and medical history. This information is not intended to replace advice given to you by your health care provider. Make sure you discuss any questions you have with your health care provider. Document Released: 06/12/2003 Document Revised: 05/16/2018 Document Reviewed: 05/16/2018 Elsevier Patient Education  2020 [employer]   EXERCISE AND DIET:  We recommended that you start or continue a regular exercise program for good health. Regular exercise means any activity that makes your heart beat faster and makes you sweat.  We recommend exercising at least 30 minutes per day at least 3 days a week, preferably 4 or 5.  We also recommend a diet low in fat and sugar.  Inactivity, poor  dietary choices and obesity can cause diabetes, heart attack, stroke, and kidney damage, among others.    ALCOHOL AND SMOKING:  Women should limit their alcohol intake to no more than 7 drinks/beers/glasses of wine (combined, not each!) per week. Moderation of alcohol intake to this level decreases your risk of breast cancer and liver damage. And of course, no recreational drugs are part of a healthy lifestyle.  And absolutely no smoking or even second hand smoke. Most people know smoking can cause heart and lung diseases, but did you know it also contributes to weakening of your bones? Aging of your skin?  Yellowing of your teeth and nails?  CALCIUM AND VITAMIN D:  Adequate intake of calcium and Vitamin D are recommended.  The recommendations for exact amounts of these supplements seem to change often, but generally speaking 1,200 mg of calcium (between diet and supplement) and 800 units of Vitamin D per day seems prudent. Certain women may benefit from higher intake of Vitamin D.  If you are among these women, your doctor will have told you during your visit.    PAP SMEARS:  Pap smears, to check for  cervical cancer or precancers,  have traditionally been done yearly, although recent scientific advances have shown that most women can have pap smears less often.  However, every woman still should have a physical exam from her gynecologist every year. It will include a breast check, inspection of the vulva and vagina to check for abnormal growths or skin changes, a visual exam of the cervix, and then an exam to evaluate the size and shape of the uterus and ovaries.  And after 53 years of age, a rectal exam is indicated to check for rectal cancers. We will also provide age appropriate advice regarding health maintenance, like when you should have certain vaccines, screening for sexually transmitted diseases, bone density testing, colonoscopy, mammograms, etc.   MAMMOGRAMS:  All women over 60 years old should  have a yearly mammogram. Many facilities now offer a "3D" mammogram, which may cost around $50 extra out of pocket. If possible,  we recommend you accept the option to have the 3D mammogram performed.  It both reduces the number of women who will be called back for extra views which then turn out to be normal, and it is better than the routine mammogram at detecting truly abnormal areas.    COLON CANCER SCREENING: Now recommend starting at age 65. At this time colonoscopy is not covered for routine screening until 50. There are take home tests that can be done between 45-49.   COLONOSCOPY:  Colonoscopy to screen for colon cancer is recommended for all women at age 77.  We know, you hate the idea of the prep.  We agree, BUT, having colon cancer and not knowing it is worse!!  Colon cancer so often starts as a polyp that can be seen and removed at colonscopy, which can quite literally save your life!  And if your first colonoscopy is normal and you have no family history of colon cancer, most women don't have to have it again for 10 years.  Once every ten years, you can do something that may end up saving your life, right?  We will be happy to help you get it scheduled when you are ready.  Be sure to check your insurance coverage so you understand how much it will cost.  It may be covered as a preventative service at no cost, but you should check your particular policy.      Breast Self-Awareness Breast self-awareness means being familiar with how your breasts look and feel. It involves checking your breasts regularly and reporting any changes to your health care provider. Practicing breast self-awareness is important. A change in your breasts can be a sign of a serious medical problem. Being familiar with how your breasts look and feel allows you to find any problems early, when treatment is more likely to be successful. All women should practice breast self-awareness, including women who have had breast  implants. How to do a breast self-exam One way to learn what is normal for your breasts and whether your breasts are changing is to do a breast self-exam. To do a breast self-exam: Look for Changes  1. Remove all the clothing above your waist. 2. Stand in front of a mirror in a room with good lighting. 3. Put your hands on your hips. 4. Push your hands firmly downward. 5. Compare your breasts in the mirror. Look for differences between them (asymmetry), such as: ? Differences in shape. ? Differences in size. ? Puckers, dips, and bumps in one breast and not the  other. 6. Look at each breast for changes in your skin, such as: ? Redness. ? Scaly areas. 7. Look for changes in your nipples, such as: ? Discharge. ? Bleeding. ? Dimpling. ? Redness. ? A change in position. Feel for Changes Carefully feel your breasts for lumps and changes. It is best to do this while lying on your back on the floor and again while sitting or standing in the shower or tub with soapy water on your skin. Feel each breast in the following way:  Place the arm on the side of the breast you are examining above your head.  Feel your breast with the other hand.  Start in the nipple area and make  inch (2 cm) overlapping circles to feel your breast. Use the pads of your three middle fingers to do this. Apply light pressure, then medium pressure, then firm pressure. The light pressure will allow you to feel the tissue closest to the skin. The medium pressure will allow you to feel the tissue that is a little deeper. The firm pressure will allow you to feel the tissue close to the ribs.  Continue the overlapping circles, moving downward over the breast until you feel your ribs below your breast.  Move one finger-width toward the center of the body. Continue to use the  inch (2 cm) overlapping circles to feel your breast as you move slowly up toward your collarbone.  Continue the up and down exam using all three  pressures until you reach your armpit.  Write Down What You Find  Write down what is normal for each breast and any changes that you find. Keep a written record with breast changes or normal findings for each breast. By writing this information down, you do not need to depend only on memory for size, tenderness, or location. Write down where you are in your menstrual cycle, if you are still menstruating. If you are having trouble noticing differences in your breasts, do not get discouraged. With time you will become more familiar with the variations in your breasts and more comfortable with the exam. How often should I examine my breasts? Examine your breasts every month. If you are breastfeeding, the best time to examine your breasts is after a feeding or after using a breast pump. If you menstruate, the best time to examine your breasts is 5-7 days after your period is over. During your period, your breasts are lumpier, and it may be more difficult to notice changes. When should I see my health care provider? See your health care provider if you notice:  A change in shape or size of your breasts or nipples.  A change in the skin of your breast or nipples, such as a reddened or scaly area.  Unusual discharge from your nipples.  A lump or thick area that was not there before.  Pain in your breasts.  Anything that concerns you.  Urinary Incontinence  Urinary incontinence refers to a condition in which a person is unable to control where and when to pass urine. A person with this condition will urinate when he or she does not mean to (involuntarily). What are the causes? This condition may be caused by:  Medicines.  Infections.  Constipation.  Overactive bladder muscles.  Weak bladder muscles.  Weak pelvic floor muscles. These muscles provide support for the bladder, intestine, and, in women, the uterus.  Enlarged prostate in men. The prostate is a gland near the bladder. When  it gets  too big, it can pinch the urethra. With the urethra blocked, the bladder can weaken and lose the ability to empty properly.  Surgery.  Emotional factors, such as anxiety, stress, or post-traumatic stress disorder (PTSD).  Pelvic organ prolapse. This happens in women when organs shift out of place and into the vagina. This shift can prevent the bladder and urethra from working properly. What increases the risk? The following factors may make you more likely to develop this condition:  Older age.  Obesity and physical inactivity.  Pregnancy and childbirth.  Menopause.  Diseases that affect the nerves or spinal cord (neurological diseases).  Long-term (chronic) coughing. This can increase pressure on the bladder and pelvic floor muscles. What are the signs or symptoms? Symptoms may vary depending on the type of urinary incontinence you have. They include:  A sudden urge to urinate, but passing urine involuntarily before you can get to a bathroom (urge incontinence).  Suddenly passing urine with any activity that forces urine to pass, such as coughing, laughing, exercise, or sneezing (stress incontinence).  Needing to urinate often, but urinating only a small amount, or constantly dribbling urine (overflow incontinence).  Urinating because you cannot get to the bathroom in time due to a physical disability, such as arthritis or injury, or communication and thinking problems, such as Alzheimer disease (functional incontinence). How is this diagnosed? This condition may be diagnosed based on:  Your medical history.  A physical exam.  Tests, such as: ? Urine tests. ? X-rays of your kidney and bladder. ? Ultrasound. ? CT scan. ? Cystoscopy. In this procedure, a health care provider inserts a tube with a light and camera (cystoscope) through the urethra and into the bladder in order to check for problems. ? Urodynamic testing. These tests assess how well the bladder,  urethra, and sphincter can store and release urine. There are different types of urodynamic tests, and they vary depending on what the test is measuring. To help diagnose your condition, your health care provider may recommend that you keep a log of when you urinate and how much you urinate. How is this treated? Treatment for this condition depends on the type of incontinence that you have and its cause. Treatment may include:  Lifestyle changes, such as: ? Quitting smoking. ? Maintaining a healthy weight. ? Staying active. Try to get 150 minutes of moderate-intensity exercise every week. Ask your health care provider which activities are safe for you. ? Eating a healthy diet.  Avoid high-fat foods, like fried foods.  Avoid refined carbohydrates like white bread and white rice.  Limit how much alcohol and caffeine you drink.  Increase your fiber intake. Foods such as fresh fruits, vegetables, beans, and whole grains are healthy sources of fiber.  Pelvic floor muscle exercises.  Bladder training, such as lengthening the amount of time between bathroom breaks, or using the bathroom at regular intervals.  Using techniques to suppress bladder urges. This can include distraction techniques or controlled breathing exercises.  Medicines to relax the bladder muscles and prevent bladder spasms.  Medicines to help slow or prevent the growth of a man's prostate.  Botox injections. These can help relax the bladder muscles.  Using pulses of electricity to help change bladder reflexes (electrical nerve stimulation).  For women, using a medical device to prevent urine leaks. This is a small, tampon-like, disposable device that is inserted into the urethra.  Injecting collagen or carbon beads (bulking agents) into the urinary sphincter. These can help thicken tissue  and close the bladder opening.  Surgery. Follow these instructions at home: Lifestyle  Limit alcohol and caffeine. These can  fill your bladder quickly and irritate it.  Keep yourself clean to help prevent odors and skin damage. Ask your doctor about special skin creams and cleansers that can protect the skin from urine.  Consider wearing pads or adult diapers. Make sure to change them regularly, and always change them right after experiencing incontinence. General instructions  Take over-the-counter and prescription medicines only as told by your health care provider.  Use the bathroom about every 3-4 hours, even if you do not feel the need to urinate. Try to empty your bladder completely every time. After urinating, wait a minute. Then try to urinate again.  Make sure you are in a relaxed position while urinating.  If your incontinence is caused by nerve problems, keep a log of the medicines you take and the times you go to the bathroom.  Keep all follow-up visits as told by your health care provider. This is important. Contact a health care provider if:  You have pain that gets worse.  Your incontinence gets worse. Get help right away if:  You have a fever or chills.  You are unable to urinate.  You have redness in your groin area or down your legs. Summary  Urinary incontinence refers to a condition in which a person is unable to control where and when to pass urine.  This condition may be caused by medicines, infection, weak bladder muscles, weak pelvic floor muscles, enlargement of the prostate (in men), or surgery.  The following factors increase your risk for developing this condition: older age, obesity, pregnancy and childbirth, menopause, neurological diseases, and chronic coughing.  There are several types of urinary incontinence. They include urge incontinence, stress incontinence, overflow incontinence, and functional incontinence.  This condition is usually treated first with lifestyle and behavioral changes, such as quitting smoking, eating a healthier diet, and doing regular pelvic  floor exercises. Other treatment options include medicines, bulking agents, medical devices, electrical nerve stimulation, or surgery. This information is not intended to replace advice given to you by your health care provider. Make sure you discuss any questions you have with your health care provider. Document Released: 10/21/2004 Document Revised: 09/23/2017 Document Reviewed: 12/23/2016 Elsevier Patient Education  2020 [employer]  Kegel Exercises  Kegel exercises can help strengthen your pelvic floor muscles. The pelvic floor is a group of muscles that support your rectum, small intestine, and bladder. In females, pelvic floor muscles also help support the womb (uterus). These muscles help you control the flow of urine and stool. Kegel exercises are painless and simple, and they do not require any equipment. Your provider may suggest Kegel exercises to:  Improve bladder and bowel control.  Improve sexual response.  Improve weak pelvic floor muscles after surgery to remove the uterus (hysterectomy) or pregnancy (females).  Improve weak pelvic floor muscles after prostate gland removal or surgery (males). Kegel exercises involve squeezing your pelvic floor muscles, which are the same muscles you squeeze when you try to stop the flow of urine or keep from passing gas. The exercises can be done while sitting, standing, or lying down, but it is best to vary your position. Exercises How to do Kegel exercises: 1. Squeeze your pelvic floor muscles tight. You should feel a tight lift in your rectal area. If you are a female, you should also feel a tightness in your vaginal area. Keep your stomach,  buttocks, and legs relaxed. 2. Hold the muscles tight for up to 10 seconds. 3. Breathe normally. 4. Relax your muscles. 5. Repeat as told by your health care provider. Repeat this exercise daily as told by your health care provider. Continue to do this exercise for at least 4-6 weeks, or for as  long as told by your health care provider. You may be referred to a physical therapist who can help you learn more about how to do Kegel exercises. Depending on your condition, your health care provider may recommend:  Varying how long you squeeze your muscles.  Doing several sets of exercises every day.  Doing exercises for several weeks.  Making Kegel exercises a part of your regular exercise routine. This information is not intended to replace advice given to you by your health care provider. Make sure you discuss any questions you have with your health care provider. Document Released: 08/30/2012 Document Revised: 05/03/2018 Document Reviewed: 05/03/2018 Elsevier Patient Education  2020 [employer].

## 2019-09-25 LAB — URINALYSIS, MICROSCOPIC ONLY
Casts: NONE SEEN /lpf
RBC, Urine: >30 /hpf — AB (ref 0–2)

## 2019-09-26 LAB — URINE CULTURE: Organism ID, Bacteria: NO GROWTH

## 2019-10-01 DIAGNOSIS — Z87898 Personal history of other specified conditions: Secondary | ICD-10-CM | POA: Insufficient documentation

## 2019-10-01 NOTE — Progress Notes (Signed)
Virtual Visit via Telephone Note   This visit type was conducted due to national recommendations for restrictions regarding the COVID-19 Pandemic (e.g. social distancing) in an effort to limit this patient's exposure and mitigate transmission in our community.  Due to her co-morbid illnesses, this patient is at least at moderate risk for complications without adequate follow up.  This format is felt to be most appropriate for this patient at this time.  The patient did not have access to video technology/had technical difficulties with video requiring transitioning to audio format only (telephone).  All issues noted in this document were discussed and addressed.  No physical exam could be performed with this format.  Please refer to the patient's chart for her  consent to telehealth for [facility].   Date:  10/02/2019   ID:  Jade Farrell, DOB April 17, 1966, MRN [id number]  Patient Location: Home Provider Location: Home  PCP:  Harlan Stains, MD  Cardiologist:  Ena Dawley, MD   Electrophysiologist:  None   Evaluation Performed:  Follow-Up Visit  Chief Complaint:  Follow up  History of Present Illness:    Jade Farrell is a 54 y.o. female with history of abnormal stress test but normal cath 2015, 2D echo 2015 normal LVEF 60 to 65% rheumatoid arthritis, obesity, anemia.   I saw the patient 07/25/2019 with palpitations.  She was drinking 10-12 tea bags daily.   I had her cut back on caffeine, labs were completely normal including thyroid, CBC and C met.  30-day monitor is still being worn but some preliminary showed sinus tachycardia with PVCs heart rate of 106 otherwise normal sinus rhythm between 68 and 90s.  I had a follow-up visit with the patient 09/12/2019 which time she was still having some fluttering but not as bad.  It was worse when she woke up from sleep apnea.  She had not cut back on her caffeine.  I asked her to dramatically cut back on her  caffeine.  30-day monitor showed sinus rhythm to sinus tachycardia and no arrhythmias or pauses.  Symptoms do not correlate with any arrhythmias.  Patient denies any further chest pain. Has cut tea back to once a day. Had a couple of episodes of fast hearts and skips but has improved since cutting back on caffeine.    The patient does not have symptoms concerning for COVID-19 infection (fever, chills, cough, or new shortness of breath).    Past Medical History:  Diagnosis Date  . Allergy   . Anemia   . Family history of breast cancer   . Genetic testing 09/16/2017   Multi-Cancer panel (83 genes) @ [facility] - No pathogenic mutations detected  . Migraine without aura   . Normal coronary arteries    a.in 2015 (done for abnormal ETT with ST depression).  . Obesity   . Rheumatoid arteritis ([place])   . Vitamin D deficiency    Past Surgical History:  Procedure Laterality Date  . [place]   Removed  . LEFT HEART CATHETERIZATION WITH CORONARY ANGIOGRAM N/A 12/28/2013   Procedure: LEFT HEART CATHETERIZATION WITH CORONARY ANGIOGRAM;  Surgeon: Peter M Martinique, MD;  Location: [facility] CATH LAB;  Service: Cardiovascular;  Laterality: N/A;     Current Meds  Medication Sig  . Biotin 5000 MCG TABS Take 1 tablet by mouth daily.  . Calcium Carbonate (CALCIUM 600 PO) Take by mouth.  . Cholecalciferol (VITAMIN D-3) 5000 UNITS TABS Take 10,000 Units by mouth daily.   Marland Kitchen  folic acid (FOLVITE) 250 MCG tablet Take 800 mcg by mouth daily.  . hydroxychloroquine (PLAQUENIL) 200 MG tablet Take 200 mg by mouth 2 (two) times daily.   . IRON PO Take by mouth daily.  . medroxyPROGESTERone (PROVERA) 5 MG tablet In early March take one tablet a day for 5 days.  . Multiple Vitamins-Minerals (MULTIVITAMIN PO) Take 1 tablet by mouth daily.      Allergies:   Fluzone [flu virus vaccine]   Social History   Tobacco Use  . Smoking status: Never Smoker  . Smokeless tobacco: Never Used  Substance Use Topics    . Alcohol use: No  . Drug use: No     Family Hx: The patient's family history includes Breast cancer (age of onset: 95) in her sister; Breast cancer (age of onset: 76) in her sister; Cancer in her paternal aunt; Diabetes in her mother and sister; Heart disease in her father and paternal uncle; Hypertension in her brother, father, and sister; Rheum arthritis in her brother; Thyroid disease in her sister.  ROS:   Please see the history of present illness.      All other systems reviewed and are negative.   Prior CV studies:   The following studies were reviewed today: 30-day monitor 09/18/2019  Sinus rhythm to sinus tachycardia.  No arrhythmias or pauses.   Normal 30-day event monitor.  Symptoms do not correlate with any arrhythmias.     Labs/Other Tests and Data Reviewed:    EKG:  30 day monitor reviewed above  Recent Labs: 08/03/2019: ALT 21; BUN 19; Creatinine, Ser 0.76; Hemoglobin 13.3; Platelets 356; Potassium 4.5; Sodium 141; TSH 1.490   Recent Lipid Panel No results found for: CHOL, TRIG, HDL, CHOLHDL, LDLCALC, LDLDIRECT  Wt Readings from Last 3 Encounters:  10/02/19 194 lb (88 kg)  09/24/19 197 lb 12.8 oz (89.7 kg)  09/12/19 185 lb (83.9 kg)     Objective:    Vital Signs:  Ht '5\' 1"'$  (1.549 m)   Wt 194 lb (88 kg)   BMI 36.66 kg/m    VITAL SIGNS:  reviewed  ASSESSMENT & PLAN:    1. Palpitations 30-day monitor showed normal sinus rhythm to sinus tachycardia with some PVCs but symptoms did not correlate with any arrhythmias.  I did ask her to dramatically cut back on her caffeine which she has done with improvement of symptoms. 2. History of chest pain with abnormal stress test in 2015 but normal cardiac cath 2015. No recent chest pain 3. Obesity- exercise size and weight loss recommended. 4. OSA not using CPAP as she didn't tolerate. Asked her to f/u with PCP about this and the importance of treating OSA.  COVID-19 Education: The signs and symptoms of  COVID-19 were discussed with the patient and how to seek care for testing (follow up with PCP or arrange E-visit).   The importance of social distancing was discussed today.  Time:   Today, I have spent 10:08 minutes with the patient with telehealth technology discussing the above problems.     Medication Adjustments/Labs and Tests Ordered: Current medicines are reviewed at length with the patient today.  Concerns regarding medicines are outlined above.   Tests Ordered: No orders of the defined types were placed in this encounter.   Medication Changes: No orders of the defined types were placed in this encounter.   Follow Up:  Either In Person or Virtual in 6 month(s) Dr. Meda Coffee  Signed, Ermalinda Barrios, PA-C  10/02/2019 1:40 PM  [facility]

## 2019-10-02 ENCOUNTER — Telehealth (INDEPENDENT_AMBULATORY_CARE_PROVIDER_SITE_OTHER): Payer: Managed Care, Other (non HMO) | Admitting: Physician Assistant

## 2019-10-02 ENCOUNTER — Other Ambulatory Visit: Payer: Self-pay

## 2019-10-02 ENCOUNTER — Encounter: Payer: Self-pay | Admitting: Physician Assistant

## 2019-10-02 VITALS — Ht 61.0 in | Wt 194.0 lb

## 2019-10-02 DIAGNOSIS — G4733 Obstructive sleep apnea (adult) (pediatric): Secondary | ICD-10-CM | POA: Diagnosis not present

## 2019-10-02 DIAGNOSIS — Z87898 Personal history of other specified conditions: Secondary | ICD-10-CM | POA: Diagnosis not present

## 2019-10-02 DIAGNOSIS — R002 Palpitations: Secondary | ICD-10-CM

## 2019-10-02 NOTE — Patient Instructions (Signed)
Medication Instructions:  No changes  *If you need a refill on your cardiac medications before your next appointment, please call your pharmacy*  Lab Work: none If you have labs (blood work) drawn today and your tests are completely normal, you will receive your results only by: Marland Kitchen MyChart Message (if you have MyChart) OR . A paper copy in the mail If you have any lab test that is abnormal or we need to change your treatment, we will call you to review the results.  Testing/Procedures: none  Follow-Up: At [facility], you and your health needs are our priority.  As part of our continuing mission to provide you with exceptional heart care, we have created designated Provider Care Teams.  These Care Teams include your primary Cardiologist (physician) and Advanced Practice Providers (APPs -  Physician Assistants and Nurse Practitioners) who all work together to provide you with the care you need, when you need it.  Your next appointment:   6 month(s)  The format for your next appointment:   Either In Person or Virtual  Provider:   Tobias Alexander, MD  Other Instructions Follow up with your primary care doctor regarding sleep apnea (OSA). Continue to limit caffeine.   Continue to work on a healthy low fat diet and getting 150 min of exercise per week.

## 2019-10-25 ENCOUNTER — Ambulatory Visit
Admission: RE | Admit: 2019-10-25 | Discharge: 2019-10-25 | Disposition: A | Discharge status: Home / Self Care | Payer: Managed Care, Other (non HMO) | Source: Ambulatory Visit | Attending: Family Medicine | Admitting: Family Medicine

## 2019-10-25 ENCOUNTER — Other Ambulatory Visit: Payer: Self-pay

## 2019-10-25 DIAGNOSIS — Z1231 Encounter for screening mammogram for malignant neoplasm of breast: Secondary | ICD-10-CM

## 2019-11-24 ENCOUNTER — Ambulatory Visit: Payer: Managed Care, Other (non HMO) | Attending: Internal Medicine

## 2019-11-24 DIAGNOSIS — Z23 Encounter for immunization: Secondary | ICD-10-CM

## 2019-11-24 NOTE — Progress Notes (Signed)
   Covid-19 Vaccination Clinic  Name:  Jade Farrell    MRN: [id number] DOB: [birth year]  11/24/2019  [name] was observed post Covid-19 immunization for 30 minutes based on pre-vaccination screening without incidence. She was provided with Vaccine Information Sheet and instruction to access the V-Safe system.   [name] was instructed to call 911 with any severe reactions post vaccine: Marland Kitchen Difficulty breathing  . Swelling of your face and throat  . A fast heartbeat  . A bad rash all over your body  . Dizziness and weakness    Immunizations Administered    Name Date Dose VIS Date Route   Pfizer COVID-19 Vaccine 11/24/2019 11:56 AM 0.3 mL 09/07/2019 Intramuscular   Manufacturer: [employer], [employer]   Lot: [id number]   NDC: [address]-1

## 2019-12-15 ENCOUNTER — Ambulatory Visit: Payer: Managed Care, Other (non HMO) | Attending: Internal Medicine

## 2019-12-15 DIAGNOSIS — Z23 Encounter for immunization: Secondary | ICD-10-CM

## 2019-12-15 NOTE — Progress Notes (Signed)
   Covid-19 Vaccination Clinic  Name:  Chantee Cerino    MRN: [id number] DOB: 20-Jul-1966  12/15/2019  [name] was observed post Covid-19 immunization for 15 minutes without incident. She was provided with Vaccine Information Sheet and instruction to access the V-Safe system.   Ms. Zaun was instructed to call 911 with any severe reactions post vaccine: Marland Kitchen Difficulty breathing  . Swelling of face and throat  . A fast heartbeat  . A bad rash all over body  . Dizziness and weakness   Immunizations Administered    Name Date Dose VIS Date Route   Pfizer COVID-19 Vaccine 12/15/2019 10:48 AM 0.3 mL 09/07/2019 Intramuscular   Manufacturer: [employer], [employer]   Lot: [id number]   NDC: [address]-9

## 2019-12-19 ENCOUNTER — Ambulatory Visit: Payer: Managed Care, Other (non HMO)

## 2020-09-30 NOTE — Progress Notes (Deleted)
55 y.o. [id number] Married Black or [place] American Not Hispanic or Latino female here for annual exam.      Patient's last menstrual period was 07/28/2013.          Sexually active: {yes no:314532}  The current method of family planning is IUD.    Exercising: {yes no:314532}  {types:19826} Smoker:  no  Health Maintenance: Pap:  08-03-17 negative, HR HPV negative  History of abnormal Pap:  no MMG:  10-25-19 density B/BIRADS 1 negative  BMD:   Never  Colonoscopy: 2018 normal per patient TDaP:  09-22-12  Gardasil: n/a   reports that she has never smoked. She has never used smokeless tobacco. She reports that she does not drink alcohol and does not use drugs.  Past Medical History:  Diagnosis Date  . Allergy   . Anemia   . Family history of breast cancer   . Genetic testing 09/16/2017   Multi-Cancer panel (83 genes) @ [facility] - No pathogenic mutations detected  . Migraine without aura   . Normal coronary arteries    a.in 2015 (done for abnormal ETT with ST depression).  . Obesity   . Rheumatoid arteritis ([facility])   . Vitamin D deficiency     Past Surgical History:  Procedure Laterality Date  . HEMORRHOID SURGERY  1994   Removed  . LEFT HEART CATHETERIZATION WITH CORONARY ANGIOGRAM N/A 12/28/2013   Procedure: LEFT HEART CATHETERIZATION WITH CORONARY ANGIOGRAM;  Surgeon: Peter M Swaziland, MD;  Location: [facility] CATH LAB;  Service: Cardiovascular;  Laterality: N/A;    Current Outpatient Medications  Medication Sig Dispense Refill  . Biotin 5000 MCG TABS Take 1 tablet by mouth daily.    . Calcium Carbonate (CALCIUM 600 PO) Take by mouth.    . Cholecalciferol (VITAMIN D-3) 5000 UNITS TABS Take 10,000 Units by mouth daily.     . folic acid (FOLVITE) 800 MCG tablet Take 800 mcg by mouth daily.    . hydroxychloroquine (PLAQUENIL) 200 MG tablet Take 200 mg by mouth 2 (two) times daily.     . IRON PO Take by mouth daily.    Marland Kitchen levonorgestrel (MIRENA, 52 MG,) 20 MCG/24HR IUD as directed    .  medroxyPROGESTERone (PROVERA) 5 MG tablet In early March take one tablet a day for 5 days. 5 tablet 0  . Multiple Vitamins-Minerals (MULTIVITAMIN PO) Take 1 tablet by mouth daily.      No current facility-administered medications for this visit.    Family History  Problem Relation Age of Onset  . Diabetes Mother   . Hypertension Father   . Heart disease Father        Blockage in his 32s  . Heart disease Paternal Uncle        Blockage in his 80s  . Breast cancer Sister 3       currenlty 59  . Hypertension Sister   . Thyroid disease Sister        Goiter  . Hypertension Brother   . Rheum arthritis Brother   . Breast cancer Sister 86       currently 56  . Diabetes Sister   . Cancer Paternal Aunt        "abdominal ca"; deceased 79    Review of Systems  Exam:   LMP 07/28/2013   Weight change: @WEIGHTCHANGE @ Height:      Ht Readings from Last 3 Encounters:  10/02/19 5\' 1"  (1.549 m)  09/24/19 5\' 1"  (1.549 m)  09/12/19 5\' 2"  (1.575  m)    General appearance: alert, cooperative and appears stated age Head: Normocephalic, without obvious abnormality, atraumatic Neck: no adenopathy, supple, symmetrical, trachea midline and thyroid {CHL AMB PHY EX THYROID NORM DEFAULT:[phone]::"normal to inspection and palpation"} Lungs: clear to auscultation bilaterally Cardiovascular: regular rate and rhythm Breasts: {Exam; breast:13139::"normal appearance, no masses or tenderness"} Abdomen: soft, non-tender; non distended,  no masses,  no organomegaly Extremities: extremities normal, atraumatic, no cyanosis or edema Skin: Skin color, texture, turgor normal. No rashes or lesions Lymph nodes: Cervical, supraclavicular, and axillary nodes normal. No abnormal inguinal nodes palpated Neurologic: Grossly normal   Pelvic: External genitalia:  no lesions              Urethra:  normal appearing urethra with no masses, tenderness or lesions              Bartholins and Skenes: normal                  Vagina: normal appearing vagina with normal color and discharge, no lesions              Cervix: {CHL AMB PHY EX CERVIX NORM DEFAULT:[phone]::"no lesions"}               Bimanual Exam:  Uterus:  {CHL AMB PHY EX UTERUS NORM DEFAULT:[phone]::"normal size, contour, position, consistency, mobility, non-tender"}              Adnexa: {CHL AMB PHY EX ADNEXA NO MASS DEFAULT:[phone]::"no mass, fullness, tenderness"}               Rectovaginal: Confirms               Anus:  normal sphincter tone, no lesions  *** chaperoned for the exam.  A:  Well Woman with normal exam  P:

## 2020-10-02 ENCOUNTER — Ambulatory Visit: Payer: Managed Care, Other (non HMO) | Admitting: Obstetrics and Gynecology

## 2020-12-09 ENCOUNTER — Other Ambulatory Visit: Payer: Self-pay | Admitting: Obstetrics and Gynecology

## 2020-12-09 DIAGNOSIS — Z1231 Encounter for screening mammogram for malignant neoplasm of breast: Secondary | ICD-10-CM

## 2021-01-30 ENCOUNTER — Other Ambulatory Visit: Payer: Self-pay

## 2021-01-30 ENCOUNTER — Ambulatory Visit
Admission: RE | Admit: 2021-01-30 | Discharge: 2021-01-30 | Disposition: A | Discharge status: Home / Self Care | Payer: Managed Care, Other (non HMO) | Source: Ambulatory Visit | Attending: Obstetrics and Gynecology | Admitting: Obstetrics and Gynecology

## 2021-01-30 DIAGNOSIS — Z1231 Encounter for screening mammogram for malignant neoplasm of breast: Secondary | ICD-10-CM

## 2021-02-16 NOTE — Progress Notes (Signed)
55 y.o. [id number] Separated Black or African American Not Hispanic or Latino female here for annual exam.  No vaginal bleeding. Her version of hot flashes is a sensation of needles all over her body, needs to get air when it happens. Doesn't happen that often. Overall tolerable, passes quickly.   Not sexually active.     H/O urge incontinence, only at night. Little better. Tolerable at the moment. Leaks small amounts.   Patient's last menstrual period was 07/28/2013.          Sexually active: No.  The current method of family planning is post menopausal status.    Exercising: No.  The patient does not participate in regular exercise at present. Smoker:  no  Health Maintenance: Pap: 08/03/2017 WNL NEG HPV History of abnormal Pap:  no MMG:  01/30/21 density B Bi-rads 1 neg  BMD:   Never  Colonoscopy: 2018 normal per patient  TDaP:  09/22/12  Gardasil: NA   reports that she has never smoked. She has never used smokeless tobacco. She reports that she does not drink alcohol and does not use drugs. 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 Teaches early education. She has 5 kids, one graduating from HS this week, one in early college for 2 more years. One in college. 2 out of college. Still has 4 at home. Oldest is in [place].   Past Medical History:  Diagnosis Date  . Allergy   . Anemia   . Family history of breast cancer   . Genetic testing 09/16/2017   Multi-Cancer panel (83 genes) @ [facility] - No pathogenic mutations detected  . Migraine without aura   . Normal coronary arteries    a.in 2015 (done for abnormal ETT with ST depression).  . Obesity   . Rheumatoid arteritis ([facility])   . Vitamin D deficiency     Past Surgical History:  Procedure Laterality Date  . HEMORRHOID SURGERY  1994   Removed  . LEFT HEART CATHETERIZATION WITH CORONARY ANGIOGRAM N/A 12/28/2013   Procedure: LEFT HEART CATHETERIZATION WITH CORONARY ANGIOGRAM;  Surgeon:  Peter M Swaziland, MD;  Location: [facility] CATH LAB;  Service: Cardiovascular;  Laterality: N/A;    Current Outpatient Medications  Medication Sig Dispense Refill  . Biotin 5000 MCG TABS Take 1 tablet by mouth daily.    . Calcium Carbonate (CALCIUM 600 PO) Take by mouth.    . Cholecalciferol (VITAMIN D-3) 5000 UNITS TABS Take 10,000 Units by mouth daily.     . folic acid (FOLVITE) 800 MCG tablet Take 800 mcg by mouth daily.    . hydroxychloroquine (PLAQUENIL) 200 MG tablet Take 200 mg by mouth 2 (two) times daily.     . IRON PO Take by mouth daily.    . medroxyPROGESTERone (PROVERA) 5 MG tablet In early March take one tablet a day for 5 days. 5 tablet 0  . Multiple Vitamins-Minerals (MULTIVITAMIN PO) Take 1 tablet by mouth daily.     . predniSONE (DELTASONE) 5 MG tablet See admin instructions.    Marland Kitchen sulfaSALAzine (AZULFIDINE) 500 MG tablet Take 1,000 mg by mouth 2 (two) times daily.     No current facility-administered medications for this visit.    Family History  Problem Relation Age of Onset  . Diabetes Mother   . Hypertension Father   . Heart disease Father        Blockage in his 31s  . Heart disease Paternal Uncle  Blockage in his 40s  . Breast cancer Sister 57       currenlty 57  . Hypertension Sister   . Thyroid disease Sister        Goiter  . Hypertension Brother   . Rheum arthritis Brother   . Breast cancer Sister 35       currently 38  . Diabetes Sister   . Cancer Paternal Aunt        "abdominal ca"; deceased 72    Review of Systems  All other systems reviewed and are negative.   Exam:   BP 122/74   Pulse 86   Ht 5\' 2"  (1.575 m)   Wt 201 lb (91.2 kg)   LMP 07/28/2013   SpO2 97%   BMI 36.76 kg/m   Weight change: @WEIGHTCHANGE @ Height:   Height: 5\' 2"  (157.5 cm)  Ht Readings from Last 3 Encounters:  02/18/21 5\' 2"  (1.575 m)  10/02/19 5\' 1"  (1.549 m)  09/24/19 5\' 1"  (1.549 m)    General appearance: alert, cooperative and appears stated age Head:  Normocephalic, without obvious abnormality, atraumatic Neck: no adenopathy, supple, symmetrical, trachea midline and thyroid right lobe is enlarged and irregular, left lobe normal Lungs: clear to auscultation bilaterally Cardiovascular: regular rate and rhythm Breasts: normal appearance, no masses or tenderness Abdomen: soft, non-tender; non distended,  no masses,  no organomegaly Extremities: extremities normal, atraumatic, no cyanosis or edema Skin: Skin color, texture, turgor normal. No rashes or lesions Lymph nodes: Cervical, supraclavicular, and axillary nodes normal. No abnormal inguinal nodes palpated Neurologic: Grossly normal   Pelvic: External genitalia:  no lesion, clitoris not seen, tissue fused in the midline               Urethra:  normal appearing urethra with no masses, tenderness or lesions              Bartholins and Skenes: normal                 Vagina: atrophic appearing vagina with normal color and discharge, no lesions              Cervix: no lesions               Bimanual Exam:  Uterus:  no masses or tenderness              Adnexa: no mass, fullness, tenderness               Rectovaginal: Confirms               Anus:  normal sphincter tone, no lesions  02/20/21 chaperoned for the exam.  1. Well woman exam Pap next year Mammogram and colonoscopy are UTD Labs with primary Discussed breast self exam Discussed calcium and vit D intake  2. Family history of breast cancer 2 of her 6 sisters with breast cancer. She had negative genetic testing.  Genetic counselor recommended yearly 3D mammograms, breast self awareness and yearly GYN visit  3. Right thyroid nodule Patient feels it may have grown She will call to set up a f/u visit with her Endocrinologist.

## 2021-02-18 ENCOUNTER — Encounter: Payer: Self-pay | Admitting: Obstetrics and Gynecology

## 2021-02-18 ENCOUNTER — Ambulatory Visit (INDEPENDENT_AMBULATORY_CARE_PROVIDER_SITE_OTHER): Payer: Managed Care, Other (non HMO) | Admitting: Obstetrics and Gynecology

## 2021-02-18 ENCOUNTER — Other Ambulatory Visit: Payer: Self-pay

## 2021-02-18 VITALS — BP 122/74 | HR 86 | Ht 62.0 in | Wt 201.0 lb

## 2021-02-18 DIAGNOSIS — Z803 Family history of malignant neoplasm of breast: Secondary | ICD-10-CM | POA: Diagnosis not present

## 2021-02-18 DIAGNOSIS — E041 Nontoxic single thyroid nodule: Secondary | ICD-10-CM

## 2021-02-18 DIAGNOSIS — Z01419 Encounter for gynecological examination (general) (routine) without abnormal findings: Secondary | ICD-10-CM

## 2021-02-18 NOTE — Patient Instructions (Signed)

## 2021-05-11 ENCOUNTER — Encounter: Payer: Self-pay | Admitting: Endocrinology

## 2021-05-11 ENCOUNTER — Ambulatory Visit: Payer: Managed Care, Other (non HMO) | Admitting: Endocrinology

## 2021-05-11 ENCOUNTER — Other Ambulatory Visit: Payer: Self-pay

## 2021-05-11 VITALS — BP 128/82 | HR 81 | Ht 62.0 in | Wt 202.0 lb

## 2021-05-11 DIAGNOSIS — E041 Nontoxic single thyroid nodule: Secondary | ICD-10-CM | POA: Diagnosis not present

## 2021-05-11 DIAGNOSIS — R1310 Dysphagia, unspecified: Secondary | ICD-10-CM | POA: Diagnosis not present

## 2021-05-11 NOTE — Progress Notes (Signed)
Patient ID: Jade Farrell, female   DOB: 07-09-1966, 55 y.o.   MRN: [id number]              Reason for Appointment: Evaluation/follow-up of thyroid nodule    History of Present Illness:   The patient is being referred by her PCP Laurann Montana MD  Background history:  The patient's thyroid nodule was first discovered in 2012 herself when she felt a firm area in her neck. She did not however get evaluated with ultrasound until 2014 which showed the following Left lobe of thyroid larger than the right. Heterogeneously solid, right lateral isthmus, 2.1 x 1.1 x 1.7 cm, also other 1.2 cm or smaller nodules present Needle aspiration of the dominant right isthmic nodule showed hyperplastic cells.  She was seen here in consultation in 2016 and repeat ultrasound ordered  FOLLOW-up thyroid ultrasound in 02/2015 showed the following  Irregular bilobed solidnodule occupying the majority of the isthmus. The nodule measures 29 x 14 x 19 mm compared to 25 x 13 x 17 mm previously. Left lobe shows areas of pseudo nodularity  Repeat biopsy showed benign follicular nodule  RECENT HISTORY:  The patient is being referred for another consultation by her PCP, she was last seen in 06/2016 At that time she had about a 2.5 cm nodule on the isthmus and since clinically it was relatively smaller no further evaluation was done  She does not think she has had any increased size of the swelling in her thyroid area No local discomfort, pressure sensation Difficulty with swallowing  TSH done by her PCP annually has been normal, last TSH was 1.16 done on 02/19/2021   Lab Results  Component Value Date   FREET4 0.73 07/09/2016   TSH 1.490 08/03/2019   TSH 1.180 12/13/2017   TSH 1.01 07/09/2016    Allergies as of 05/11/2021       Reactions   Fluzone [influenza Virus Vaccine] Anaphylaxis, Swelling        Medication List        Accurate as of May 11, 2021  2:43 PM. If you have any  questions, ask your nurse or doctor.          Biotin 5000 MCG Tabs Take 1 tablet by mouth daily.   CALCIUM 600 PO Take by mouth.   folic acid 800 MCG tablet Commonly known as: FOLVITE Take 800 mcg by mouth daily.   hydroxychloroquine 200 MG tablet Commonly known as: PLAQUENIL Take 200 mg by mouth 2 (two) times daily.   IRON PO Take by mouth daily.   medroxyPROGESTERone 5 MG tablet Commonly known as: Provera In early March take one tablet a day for 5 days.   MULTIVITAMIN PO Take 1 tablet by mouth daily.   predniSONE 5 MG tablet Commonly known as: DELTASONE See admin instructions.   sulfaSALAzine 500 MG tablet Commonly known as: AZULFIDINE Take 1,000 mg by mouth 2 (two) times daily.   Vitamin D-3 125 MCG (5000 UT) Tabs Take 10,000 Units by mouth daily.        Allergies:  Allergies  Allergen Reactions   Fluzone [Influenza Virus Vaccine] Anaphylaxis and Swelling    Past Medical History:  Diagnosis Date   Allergy    Anemia    Family history of breast cancer    Genetic testing 09/16/2017   Multi-Cancer panel (83 genes) @ [facility] - No pathogenic mutations detected   Migraine without aura    Normal coronary arteries    a.in 2015 (  done for abnormal ETT with ST depression).   Obesity    Rheumatoid arteritis (HCC)    Vitamin D deficiency     There is no history of radiation to the neck in childhood  Past Surgical History:  Procedure Laterality Date   HEMORRHOID SURGERY  1994   Removed   LEFT HEART CATHETERIZATION WITH CORONARY ANGIOGRAM N/A 12/28/2013   Procedure: LEFT HEART CATHETERIZATION WITH CORONARY ANGIOGRAM;  Surgeon: Peter M Swaziland, MD;  Location: [facility] CATH LAB;  Service: Cardiovascular;  Laterality: N/A;    Family History  Problem Relation Age of Onset   Diabetes Mother    Hypertension Father    Heart disease Father        Blockage in his 14s   Heart disease Paternal Uncle        Blockage in his 23s   Breast cancer Sister 50        currenlty 54   Hypertension Sister    Thyroid disease Sister        Goiter   Hypertension Brother    Rheum arthritis Brother    Breast cancer Sister 56       currently 54   Diabetes Sister    Cancer Paternal Aunt        "abdominal ca"; deceased 39    Social History:  reports that she has never smoked. She has never used smokeless tobacco. She reports that she does not drink alcohol and does not use drugs.   Review of Systems  Constitutional:  Negative for weight loss, weight gain and reduced appetite.  HENT:  Negative for trouble swallowing.   Cardiovascular:  Negative for leg swelling.  Endocrine: Negative for fatigue.  Musculoskeletal:  Positive for joint pain.       She has rheumatoid arthritis on Plaquenil and primarily has pain in the hands  Skin:        Hair loss, chronic, may be from Plaquenil   Periodically gets pain in the upper right neck on the side usually gets better in a couple of days, not sure of this gets tender, mostly noticeable on swallowing and is an external pain   Examination:   BP 128/82 (BP Location: Right Arm, Patient Position: Sitting, Cuff Size: Normal)   Pulse 81   Ht 5\' 2"  (1.575 m)   Wt 202 lb (91.6 kg)   LMP 07/28/2013   SpO2 94%   BMI 36.95 kg/m      General Appearance: Well built and nourished, pleasant and in no acute distress  Eye exam externally: No prominence or swelling of the eyes          THYROID exam  There is a firm relatively flat smooth thyroid nodule in the isthmus and to the right measuring about 3-3.2 cm   Her right thyroid lobe is not clearly palpable  Left lobe feels firm and has a indistinct nodule about 2-2.5 cm  Neck: No tenderness of the carotid bulbs, slight soft enlargement of the left submandibular salivary gland felt  Systemic exam not indicated for chest and abdomen  Reflexes at biceps are normal.  Skin: No rash or lesions  Extremities: No significant swelling of the finger joints, no deformity  No  edema  Assessment/Plan:  Multinodular goiter:  She has had a dominant right isthmus nodule for about 10 years now This has been biopsied twice with benign outcomes  This nodule may be only slightly larger than on her last visit 5 years ago and likely does  not need any further evaluation with the above history Explained to her that benign nodules may grow slowly and has done so mostly in the first few years of diagnosis  However not clear if the left thyroid lobe has a new nodule or increase in size of the goiter otherwise on her exam today  Follow-up ultrasound is being ordered today and will discuss results with the patient  Recurrent left neck pain: 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

## 2021-05-21 ENCOUNTER — Ambulatory Visit
Admission: RE | Admit: 2021-05-21 | Discharge: 2021-05-21 | Disposition: A | Discharge status: Home / Self Care | Payer: Managed Care, Other (non HMO) | Source: Ambulatory Visit | Attending: Endocrinology | Admitting: Endocrinology

## 2021-05-21 DIAGNOSIS — E041 Nontoxic single thyroid nodule: Secondary | ICD-10-CM

## 2021-06-09 NOTE — Progress Notes (Signed)
Please call to let patient know that the thyroid ultrasound results show no change in the nodules and no further action needed

## 2022-02-16 NOTE — Progress Notes (Deleted)
56 y.o. [id number] Legally Separated Black or African American Not Hispanic or Latino female here for annual exam.      Patient's last menstrual period was 07/28/2013.          Sexually active: {yes no:314532}  The current method of family planning is {contraception:315051}.    Exercising: {yes no:314532}  {types:19826} Smoker:  {YES [id number]  Health Maintenance: Pap:  08/03/2017 WNL NEG HPV History of abnormal Pap:  no MMG:  01/30/21 Bi-rads 1 neg  BMD:   none  Colonoscopy: 2018 normal per patient  TDaP:  09/22/12  Gardasil: n/a   reports that she has never smoked. She has never used smokeless tobacco. She reports that she does not drink alcohol and does not use drugs.  Past Medical History:  Diagnosis Date   Allergy    Anemia    Family history of breast cancer    Genetic testing 09/16/2017   Multi-Cancer panel (83 genes) @ [facility] - No pathogenic mutations detected   Migraine without aura    Normal coronary arteries    a.in 2015 (done for abnormal ETT with ST depression).   Obesity    Rheumatoid arteritis (HCC)    Vitamin D deficiency     Past Surgical History:  Procedure Laterality Date   HEMORRHOID SURGERY  1994   Removed   LEFT HEART CATHETERIZATION WITH CORONARY ANGIOGRAM N/A 12/28/2013   Procedure: LEFT HEART CATHETERIZATION WITH CORONARY ANGIOGRAM;  Surgeon: Peter M Swaziland, MD;  Location: [facility] CATH LAB;  Service: Cardiovascular;  Laterality: N/A;    Current Outpatient Medications  Medication Sig Dispense Refill   Biotin 5000 MCG TABS Take 1 tablet by mouth daily.     Calcium Carbonate (CALCIUM 600 PO) Take by mouth.     Cholecalciferol (VITAMIN D-3) 5000 UNITS TABS Take 10,000 Units by mouth daily.      folic acid (FOLVITE) 800 MCG tablet Take 800 mcg by mouth daily.     hydroxychloroquine (PLAQUENIL) 200 MG tablet Take 200 mg by mouth 2 (two) times daily.      IRON PO Take by mouth daily.     Multiple Vitamins-Minerals (MULTIVITAMIN PO) Take 1 tablet by mouth daily.       predniSONE (DELTASONE) 5 MG tablet See admin instructions.     sulfaSALAzine (AZULFIDINE) 500 MG tablet Take 1,000 mg by mouth 2 (two) times daily.     No current facility-administered medications for this visit.    Family History  Problem Relation Age of Onset   Diabetes Mother    Hypertension Father    Heart disease Father        Blockage in his 23s   Cancer Sister    Breast cancer Sister 2       currenlty 41   Hypertension Sister    Thyroid disease Sister        Goiter   Cancer Sister    Breast cancer Sister 50       currently 52   Diabetes Sister    Hypertension Brother    Rheum arthritis Brother    Cancer Paternal Aunt        "abdominal ca"; deceased 93   Heart disease Paternal Uncle        Blockage in his 64s    Review of Systems  Exam:   LMP 07/28/2013   Weight change: @WEIGHTCHANGE @ Height:      Ht Readings from Last 3 Encounters:  05/11/21 5\' 2"  (1.575 m)  02/18/21 5\' 2"  (1.575  m)  10/02/19 5\' 1"  (1.549 m)    General appearance: alert, cooperative and appears stated age Head: Normocephalic, without obvious abnormality, atraumatic Neck: no adenopathy, supple, symmetrical, trachea midline and thyroid {CHL AMB PHY EX THYROID NORM [phone]::"normal to inspection and palpation"} Lungs: clear to auscultation bilaterally Cardiovascular: regular rate and rhythm Breasts: {Exam; breast:13139::"normal appearance, no masses or tenderness"} Abdomen: soft, non-tender; non distended,  no masses,  no organomegaly Extremities: extremities normal, atraumatic, no cyanosis or edema Skin: Skin color, texture, turgor normal. No rashes or lesions Lymph nodes: Cervical, supraclavicular, and axillary nodes normal. No abnormal inguinal nodes palpated Neurologic: Grossly normal   Pelvic: External genitalia:  no lesions              Urethra:  normal appearing urethra with no masses, tenderness or lesions              Bartholins and Skenes: normal                  Vagina: normal appearing vagina with normal color and discharge, no lesions              Cervix: {CHL AMB PHY EX CERVIX NORM [phone]::"no lesions"}               Bimanual Exam:  Uterus:  {CHL AMB PHY EX UTERUS NORM [phone]::"normal size, contour, position, consistency, mobility, non-tender"}              Adnexa: {CHL AMB PHY EX ADNEXA NO MASS DEFAULT:[phone]::"no mass, fullness, tenderness"}               Rectovaginal: Confirms               Anus:  normal sphincter tone, no lesions  *** chaperoned for the exam.  A:  Well Woman with normal exam  P:

## 2022-02-24 ENCOUNTER — Ambulatory Visit: Payer: Managed Care, Other (non HMO) | Admitting: Obstetrics and Gynecology

## 2022-05-10 ENCOUNTER — Ambulatory Visit: Payer: Managed Care, Other (non HMO) | Admitting: Endocrinology

## 2022-08-30 ENCOUNTER — Other Ambulatory Visit: Payer: Self-pay | Admitting: Obstetrics and Gynecology

## 2022-08-30 DIAGNOSIS — Z1231 Encounter for screening mammogram for malignant neoplasm of breast: Secondary | ICD-10-CM

## 2022-09-16 ENCOUNTER — Ambulatory Visit
Admission: RE | Admit: 2022-09-16 | Discharge: 2022-09-16 | Disposition: A | Discharge status: Home / Self Care | Payer: BC Managed Care – PPO | Source: Ambulatory Visit | Attending: Obstetrics and Gynecology | Admitting: Obstetrics and Gynecology

## 2022-09-16 DIAGNOSIS — Z1231 Encounter for screening mammogram for malignant neoplasm of breast: Secondary | ICD-10-CM
